# Patient Record
Sex: Female | Born: 1975 | Race: White | Hispanic: No | Smoking: Current every day smoker
Health system: Southern US, Community
[De-identification: ages and names within clinical notes are randomized; demographics above are authoritative.]

## PROBLEM LIST (undated history)

## (undated) DIAGNOSIS — N3281 Overactive bladder: Secondary | ICD-10-CM

## (undated) DIAGNOSIS — F329 Major depressive disorder, single episode, unspecified: Secondary | ICD-10-CM

## (undated) DIAGNOSIS — H7290 Unspecified perforation of tympanic membrane, unspecified ear: Secondary | ICD-10-CM

## (undated) DIAGNOSIS — F32A Depression, unspecified: Secondary | ICD-10-CM

## (undated) DIAGNOSIS — Z8489 Family history of other specified conditions: Secondary | ICD-10-CM

## (undated) DIAGNOSIS — F410 Panic disorder [episodic paroxysmal anxiety] without agoraphobia: Secondary | ICD-10-CM

## (undated) DIAGNOSIS — R238 Other skin changes: Secondary | ICD-10-CM

## (undated) DIAGNOSIS — I639 Cerebral infarction, unspecified: Secondary | ICD-10-CM

## (undated) DIAGNOSIS — F319 Bipolar disorder, unspecified: Secondary | ICD-10-CM

## (undated) DIAGNOSIS — Z22322 Carrier or suspected carrier of Methicillin resistant Staphylococcus aureus: Secondary | ICD-10-CM

## (undated) DIAGNOSIS — F419 Anxiety disorder, unspecified: Secondary | ICD-10-CM

## (undated) DIAGNOSIS — D649 Anemia, unspecified: Secondary | ICD-10-CM

## (undated) DIAGNOSIS — K5792 Diverticulitis of intestine, part unspecified, without perforation or abscess without bleeding: Secondary | ICD-10-CM

## (undated) DIAGNOSIS — R233 Spontaneous ecchymoses: Secondary | ICD-10-CM

## (undated) DIAGNOSIS — B009 Herpesviral infection, unspecified: Secondary | ICD-10-CM

## (undated) DIAGNOSIS — K589 Irritable bowel syndrome without diarrhea: Secondary | ICD-10-CM

## (undated) DIAGNOSIS — I10 Essential (primary) hypertension: Secondary | ICD-10-CM

## (undated) DIAGNOSIS — R519 Headache, unspecified: Secondary | ICD-10-CM

## (undated) HISTORY — PX: ABDOMINAL HYSTERECTOMY: SHX81

## (undated) HISTORY — PX: MULTIPLE TOOTH EXTRACTIONS: SHX2053

## (undated) HISTORY — PX: TUBAL LIGATION: SHX77

## (undated) HISTORY — PX: COLONOSCOPY: SHX174

---
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
## 1997-10-09 ENCOUNTER — Inpatient Hospital Stay (HOSPITAL_COMMUNITY): Admission: AD | Admit: 1997-10-09 | Discharge: 1997-10-09 | Payer: Self-pay | Admitting: Obstetrics

## 1997-11-05 ENCOUNTER — Ambulatory Visit (HOSPITAL_COMMUNITY): Admission: RE | Admit: 1997-11-05 | Discharge: 1997-11-05 | Payer: Self-pay | Admitting: *Deleted

## 1998-02-26 ENCOUNTER — Inpatient Hospital Stay (HOSPITAL_COMMUNITY): Admission: AD | Admit: 1998-02-26 | Discharge: 1998-02-26 | Payer: Self-pay | Admitting: *Deleted

## 1998-03-05 ENCOUNTER — Inpatient Hospital Stay (HOSPITAL_COMMUNITY): Admission: AD | Admit: 1998-03-05 | Discharge: 1998-03-07 | Payer: Self-pay | Admitting: *Deleted

## 1998-12-28 ENCOUNTER — Emergency Department (HOSPITAL_COMMUNITY): Admission: EM | Admit: 1998-12-28 | Discharge: 1998-12-28 | Payer: Self-pay | Admitting: Emergency Medicine

## 1999-03-04 ENCOUNTER — Ambulatory Visit (HOSPITAL_COMMUNITY): Admission: RE | Admit: 1999-03-04 | Discharge: 1999-03-04 | Payer: Self-pay | Admitting: *Deleted

## 2001-02-04 ENCOUNTER — Emergency Department (HOSPITAL_COMMUNITY): Admission: EM | Admit: 2001-02-04 | Discharge: 2001-02-04 | Payer: Self-pay | Admitting: Physical Therapy

## 2003-04-19 ENCOUNTER — Emergency Department (HOSPITAL_COMMUNITY): Admission: EM | Admit: 2003-04-19 | Discharge: 2003-04-19 | Payer: Self-pay | Admitting: Emergency Medicine

## 2004-11-19 IMAGING — CT CT ABDOMEN W/ CM
1 of 4 series · 14 of 32 positions shown, 19 images · IV contrast (omnipaque)
Comparison: none

CLINICAL DATA: Abdominal pain. 
 CT ABDOMEN WITH CONTRAST
 Multidetector helical scans through the abdomen were performed after oral and IV contrast media were given.   150 cc of Omnipaque 300 were given as the contrast media.  
 The lung bases are clear.   The liver enhances normally with no focal abnormality and no ductal dilatation is seen.   No gallstones are noted.  The pancreas is normal in size as are the adrenal glands and spleen.  The kidneys enhance normally.  The abdominal aorta is normal in caliber.
 IMPRESSION
 Negative CT scan of the abdomen.
 CT PELVIS WITH CONTRAST
 Scans were continued through the pelvis after oral and IV contrast media were given.  The appendix is well seen and appears normal.  There are multiple low attenuation structures in both adnexa consistent with multiple bilateral follicles.  No diverticulitis is seen.  The urinary bladder is unremarkable.   The uterus is normal in size.  No fluid is seen in the cul-de-sac.
 1.  The appendix appears normal.
 2.  Probable small bilateral ovarian follicles.  No significant free fluid is seen.
 3.  No CT evidence of diverticulitis.

[Series 2: abd/pelvis 5.0 b30f · axial · 0.66mm/px · z∈[-716,-336]mm · 14 of 88 slices shown, 19 images]
[im 6/88  soft-tissue]
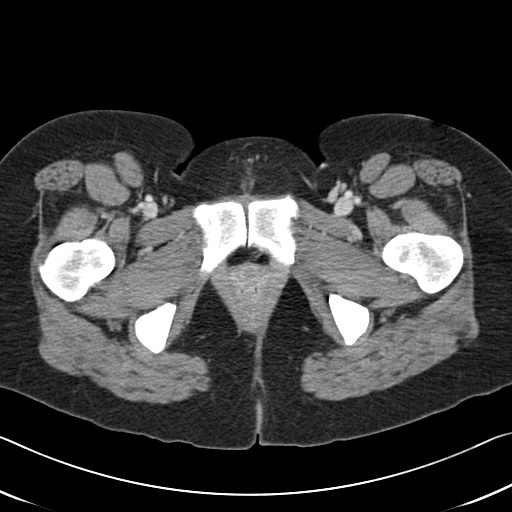
[im 6/88  bone]
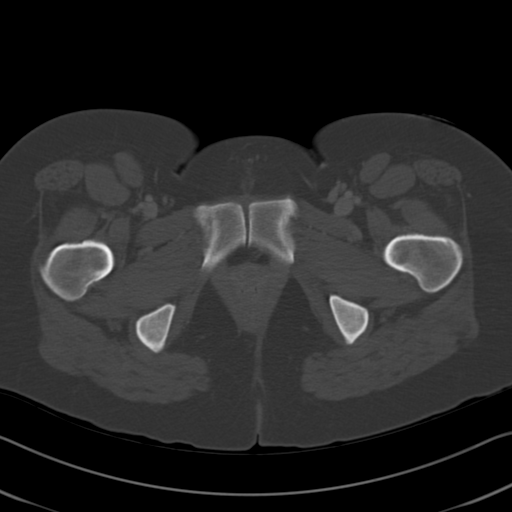
[im 11/88  soft-tissue]
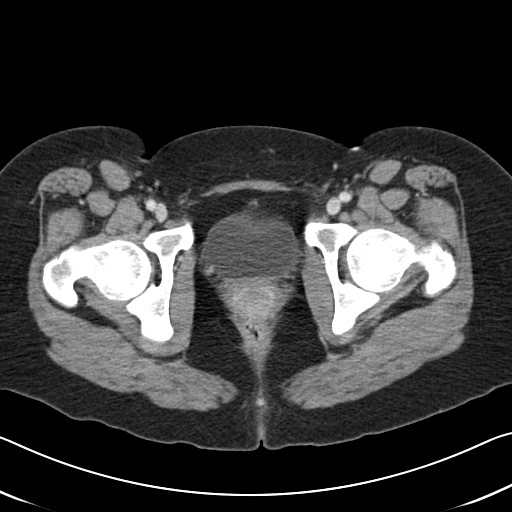
[im 17/88  soft-tissue]
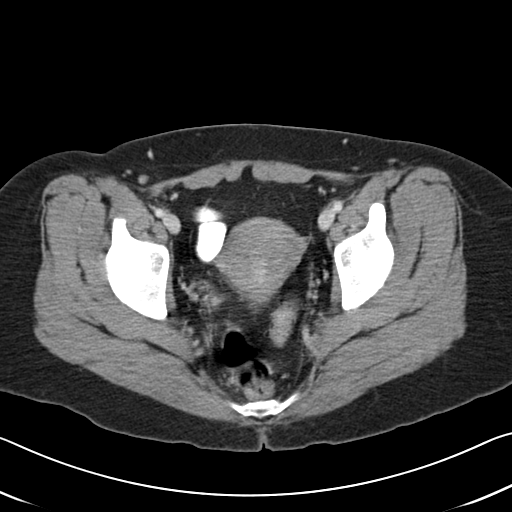
[im 28/88  soft-tissue]
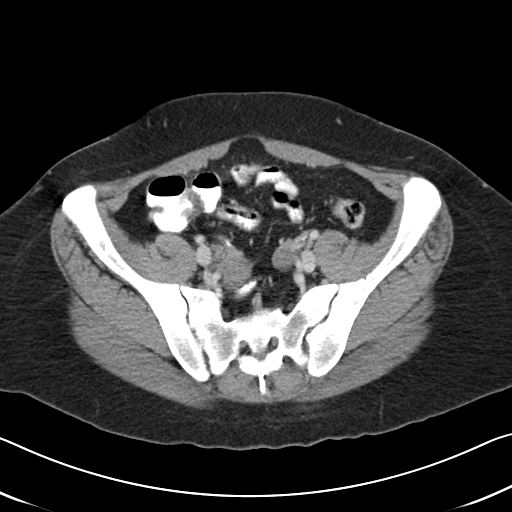
[im 33/88  soft-tissue]
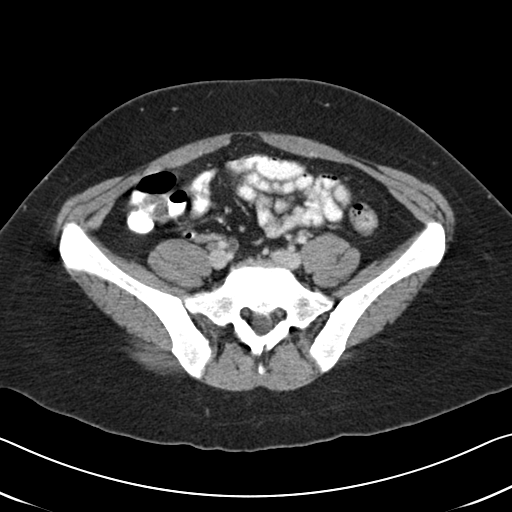
[im 39/88  soft-tissue]
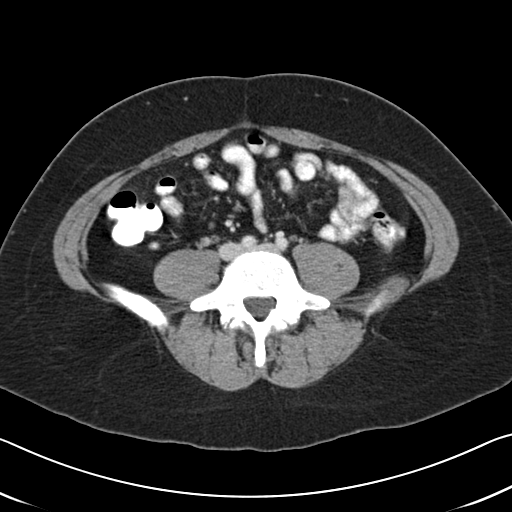
[im 44/88  soft-tissue]
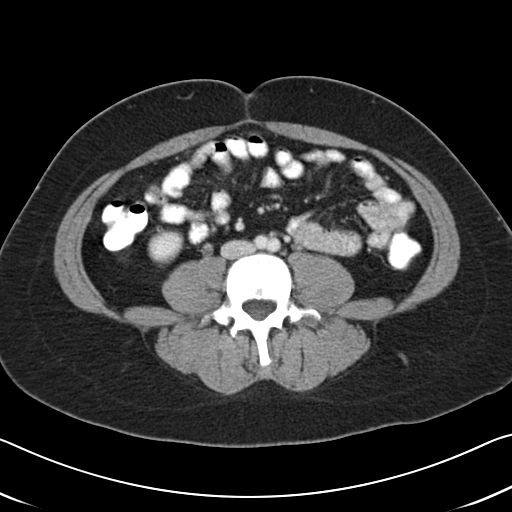
[im 49/88  soft-tissue]
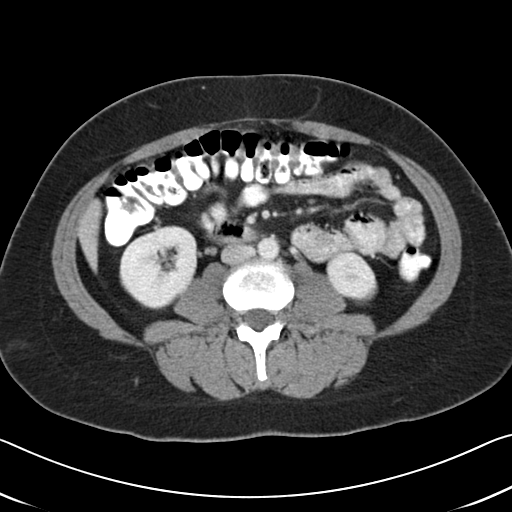
[im 55/88  soft-tissue]
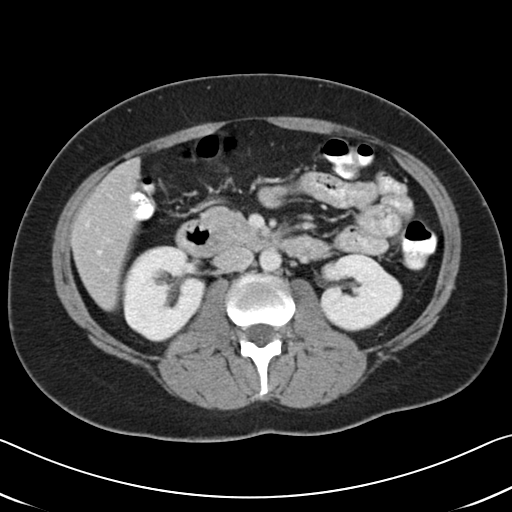
[im 55/88  bone]
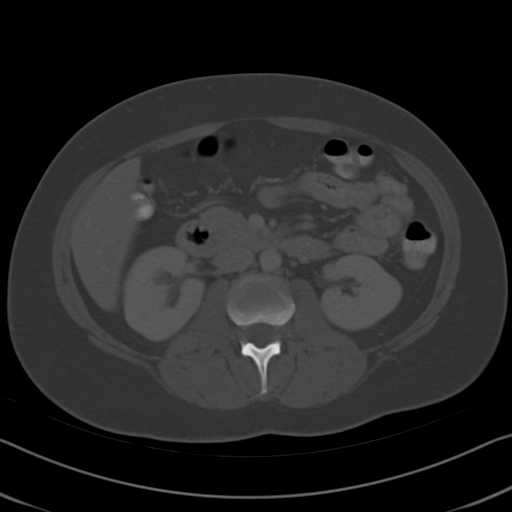
[im 60/88  soft-tissue]
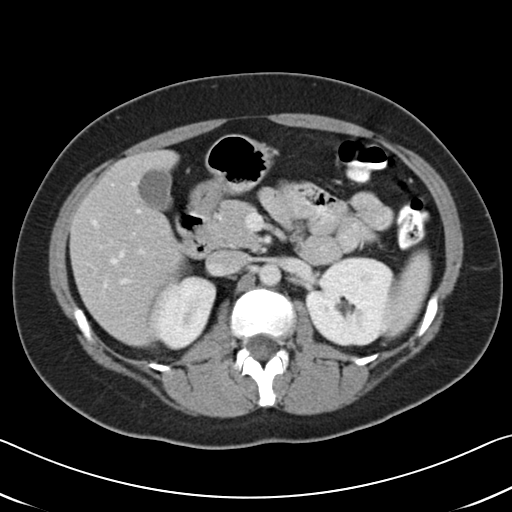
[im 66/88  lung]
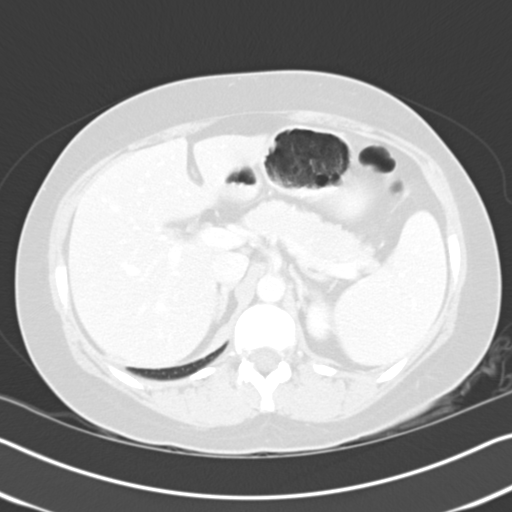
[im 71/88  soft-tissue]
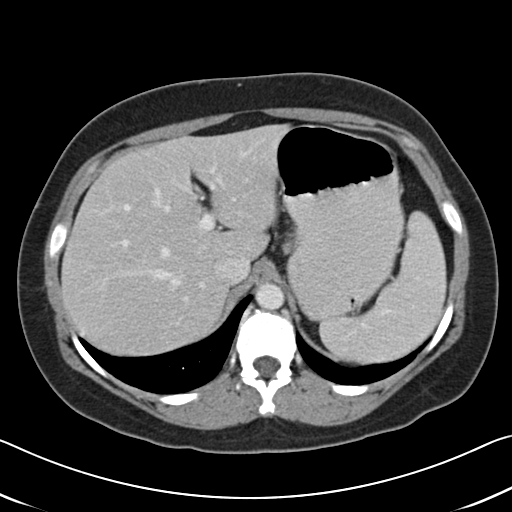
[im 71/88  lung]
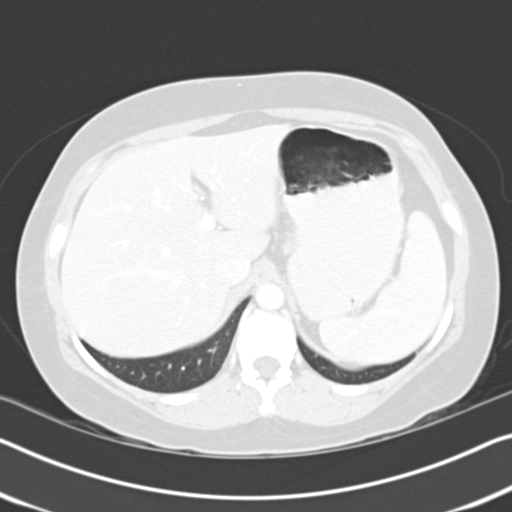
[im 77/88  soft-tissue]
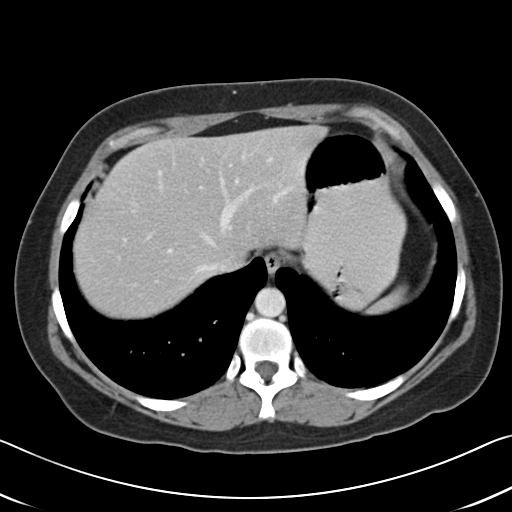
[im 77/88  lung]
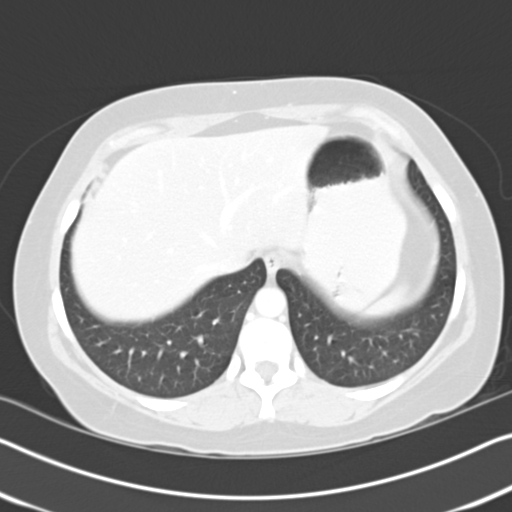
[im 82/88  soft-tissue]
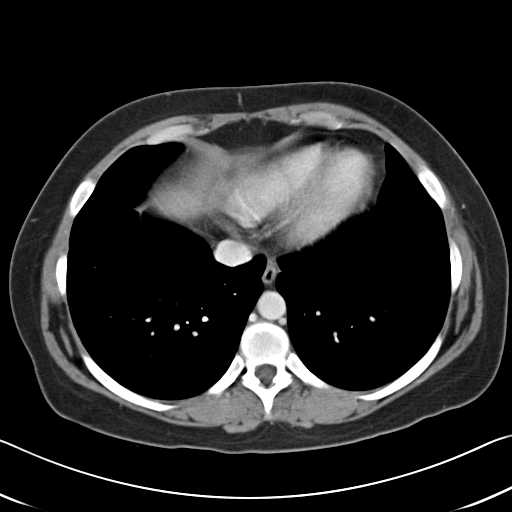
[im 82/88  lung]
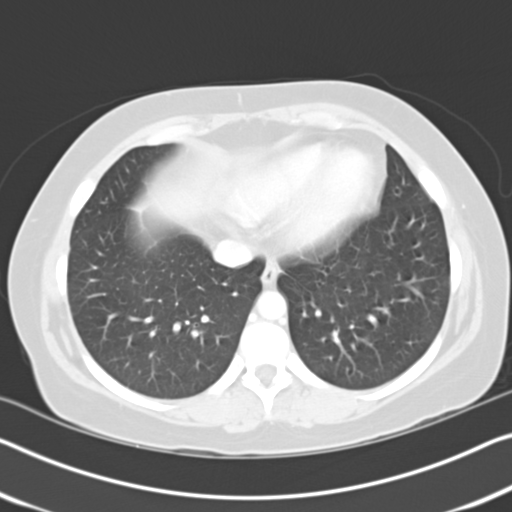

[14 of 32 positions shown; findings below may reference images not displayed]

## 2005-10-09 ENCOUNTER — Emergency Department (HOSPITAL_COMMUNITY): Admission: EM | Admit: 2005-10-09 | Discharge: 2005-10-09 | Payer: Self-pay | Admitting: Emergency Medicine

## 2005-10-12 ENCOUNTER — Emergency Department (HOSPITAL_COMMUNITY): Admission: EM | Admit: 2005-10-12 | Discharge: 2005-10-12 | Payer: Self-pay | Admitting: Emergency Medicine

## 2007-05-15 IMAGING — CR DG ANKLE COMPLETE 3+V*R*
3 series · 3 of 3 positions shown · non-contrast
Comparison: none

CLINICAL DATA: Fall, pain. 
 RIGHT ANKLE ? 3 VIEW:

[t ankle joint lat right]
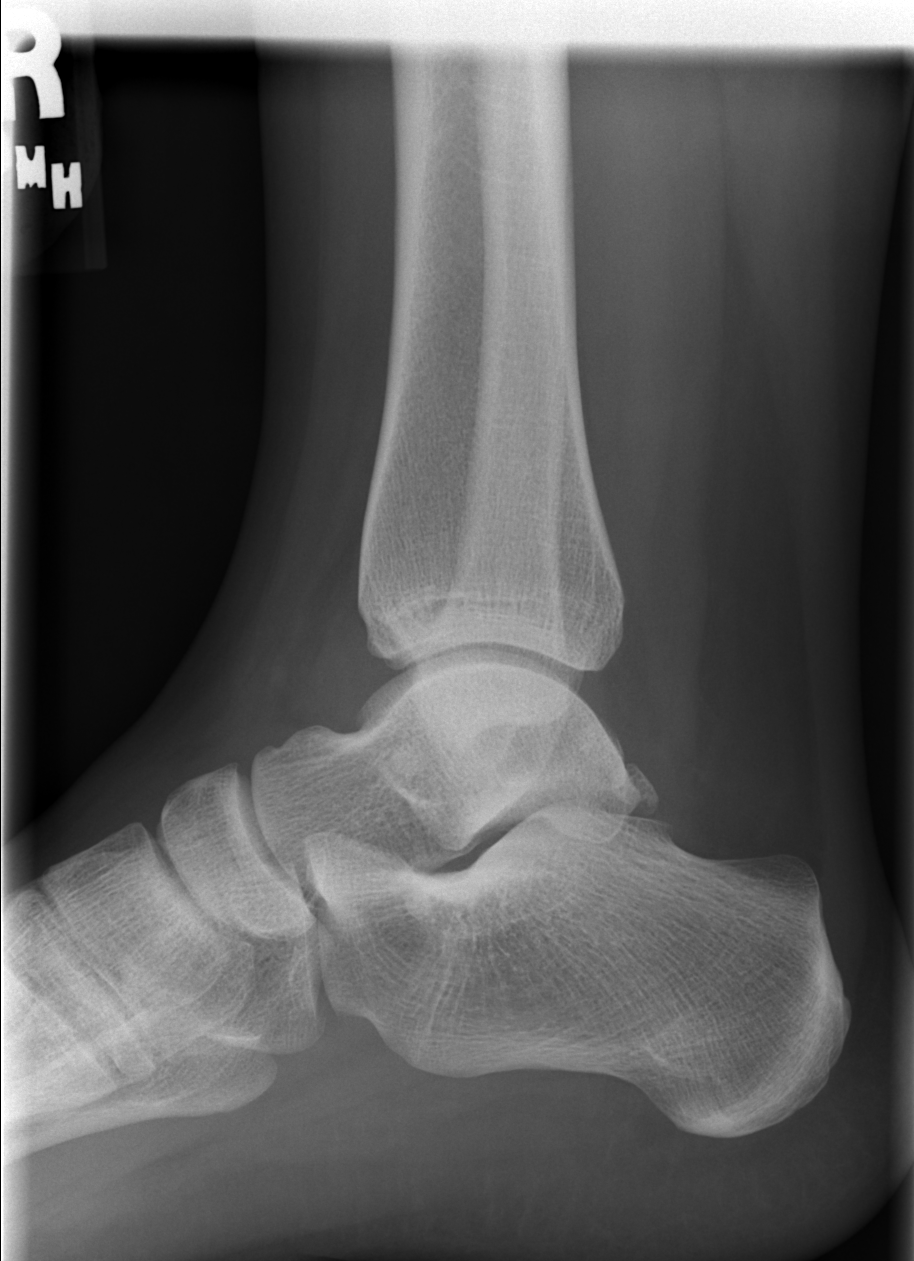

[t ankle joint ap right]
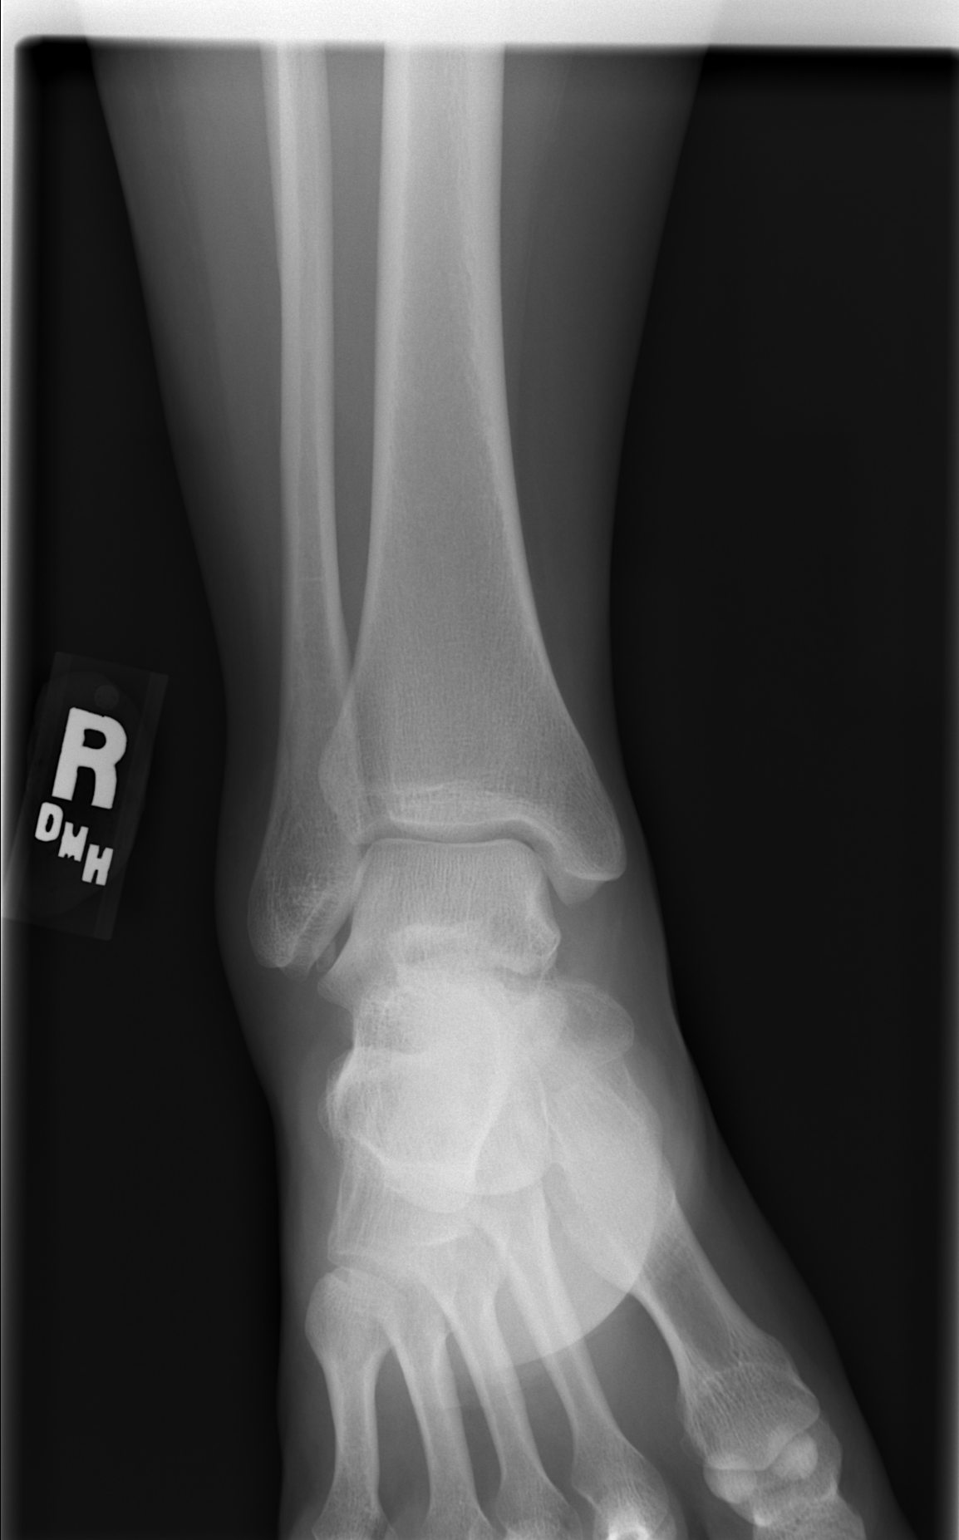

[t ankle joint oblique right]
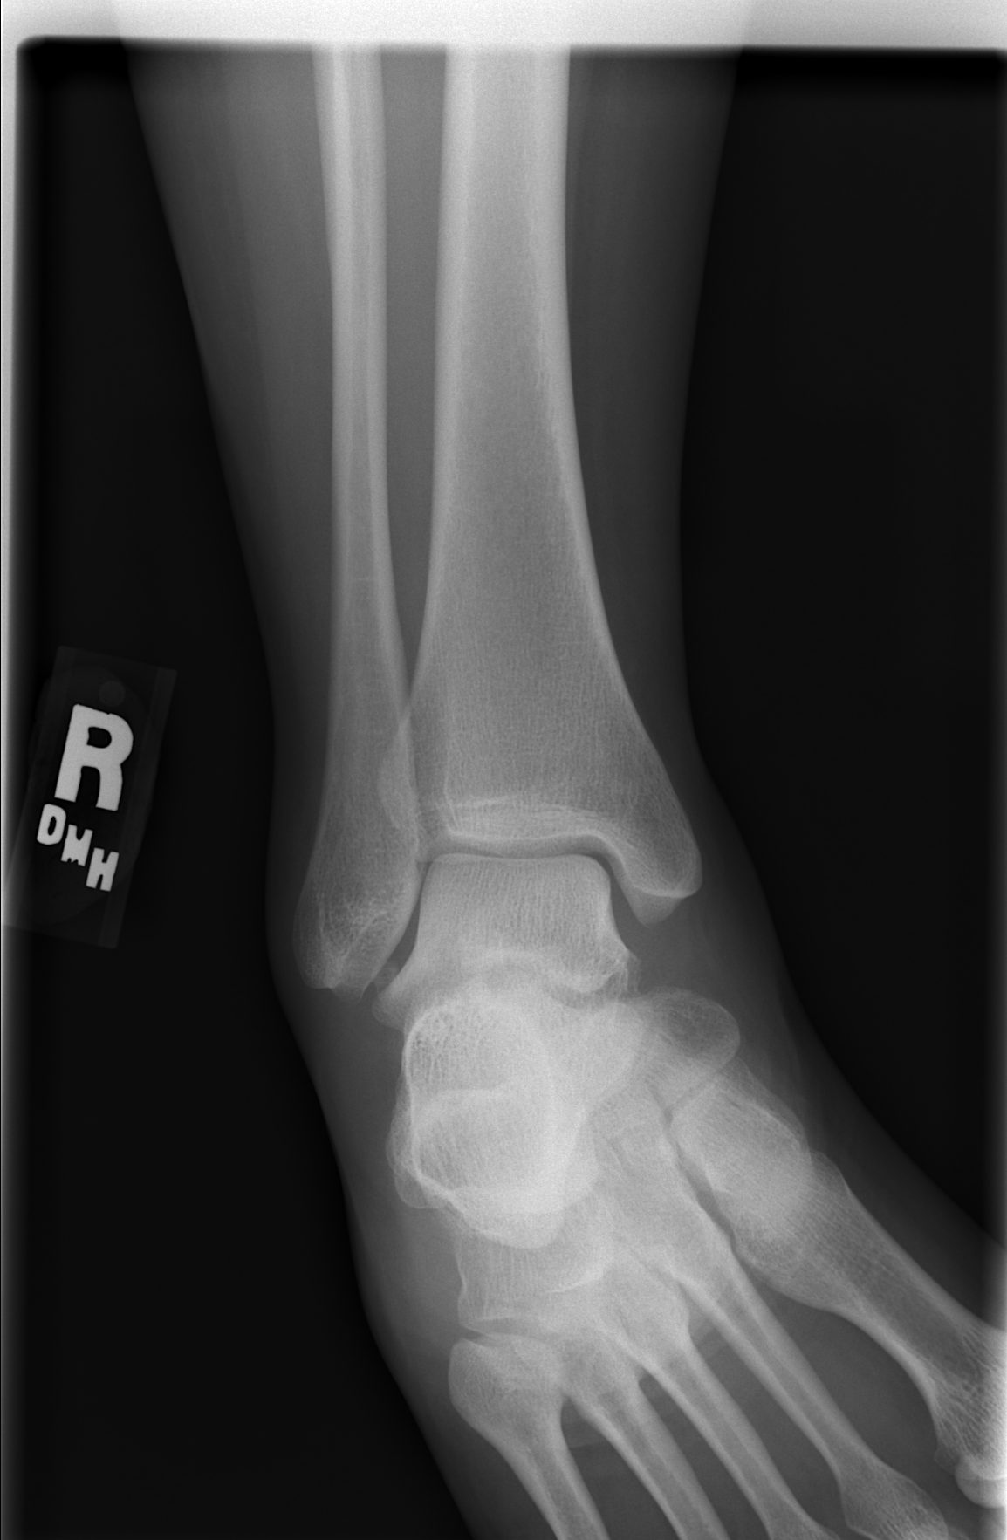

[3 of 3 positions shown; findings below may reference images not displayed]

FINDINGS: A well-corticated bony fragment is seen off the lateral malleolus likely due to remote injury.  No definite acute bony or joint abnormality is seen.  There is some mild soft tissue swelling about the lateral aspect of the ankle.  No joint effusion.
IMPRESSION: Mild lateral soft tissue swelling.  No acute finding.

## 2012-07-06 ENCOUNTER — Other Ambulatory Visit: Payer: Self-pay | Admitting: Obstetrics and Gynecology

## 2012-07-07 ENCOUNTER — Encounter (HOSPITAL_COMMUNITY): Payer: Self-pay

## 2012-07-07 ENCOUNTER — Encounter (HOSPITAL_COMMUNITY)
Admission: RE | Admit: 2012-07-07 | Discharge: 2012-07-07 | Disposition: A | Discharge status: Home / Self Care | Payer: Medicaid Other | Source: Ambulatory Visit | Attending: Obstetrics and Gynecology | Admitting: Obstetrics and Gynecology

## 2012-07-07 LAB — CBC
MCH: 29.9 pg (ref 26.0–34.0)
MCHC: 32.5 g/dL (ref 30.0–36.0)
Platelets: 301 10*3/uL (ref 150–400)
RBC: 4.51 MIL/uL (ref 3.87–5.11)

## 2012-07-07 LAB — SURGICAL PCR SCREEN: MRSA, PCR: NEGATIVE

## 2012-07-07 NOTE — Patient Instructions (Addendum)
   Your procedure is scheduled ZO:[id number] April 15th  Enter through the Main Entrance of [facility] at:9am Pick up the phone at the desk and dial [phone] and inform us of your arrival.  Please call this number if you have any problems the morning of surgery: [phone]  Remember: Do not eat or drink anything after midnight on Monday   Do not wear jewelry, make-up, or FINGER nail polish No metal in your hair or on your body. Do not wear lotions, powders, perfumes. You may wear deodorant.  Please use your CHG wash as directed prior to surgery.  Do not shave anywhere for at least 12 hours prior to first CHG shower.  Do not bring valuables to the hospital.   Leave suitcase in the car. After Surgery it may be brought to your room. For patients being admitted to the hospital, checkout time is 11:00am the day of discharge.     -+

## 2012-07-17 NOTE — H&P (Signed)
Tabitha Christensen is an 37 y.o. female with a history of progressive dysmenorrhea and pelvic pain. Has not responded to conservative treatment and patient no desires definitive treatment by hysterectomy and possible bilateral salpingo oophorectomy. She has had prior tubal ligation and in an effort to avoid surgery she was treated with depo Lupron 3.75 mgs. She stopped this treatment due to side effects and at this point wants to proceed with surgical treatment.   Pertinent Gynecological History: Menses: flow is moderate but with severe pain  Contraception: tubal ligation Previous GYN Procedures: Tubal sterilization  Last pap: normal Date: 01/2012 OB History: G4 P3013     No past medical history on file.  Past Surgical History  Procedure Laterality Date  . Tubal ligation      No family history on file.  Social History:  reports that she has been smoking Cigarettes.  She has a 15 pack-year smoking history. She does not have any smokeless tobacco history on file. She reports that  drinks alcohol. She reports that she does not use illicit drugs.  Allergies:  Allergies  Allergen Reactions  . Codeine Nausea And Vomiting    No prescriptions prior to admission    Review of Systems  Constitutional: Positive for weight loss. Negative for fever and chills.  Respiratory: Negative for cough, hemoptysis, sputum production and shortness of breath.   Cardiovascular: Negative.  Negative for chest pain, palpitations, orthopnea and claudication.  Gastrointestinal: Negative for heartburn, nausea, vomiting, abdominal pain, diarrhea, constipation, blood in stool and melena.  Genitourinary: Negative for dysuria, urgency, frequency and hematuria.  Gyn: cycles are regular and extremely painful. Also having pain with sex. No discharge or itch. Recent STD screen was negative.  There were no vitals taken for this visit. Physical Exam  Constitutional: She is oriented to person, place, and time. She appears  well-developed.  HENT:  Head: Normocephalic and atraumatic.  Eyes: Conjunctivae and EOM are normal. Pupils are equal, round, and reactive to light.  Neck: Normal range of motion. Neck supple. No tracheal deviation present. No thyromegaly present.  Cardiovascular: Normal rate, regular rhythm and normal heart sounds.  Exam reveals no gallop.   No murmur heard. Respiratory: Effort normal and breath sounds normal. She has no wheezes. She has no rales.  GI: Soft. Bowel sounds are normal. She exhibits no distension and no mass. There is no tenderness. There is no rebound and no guarding.  Neurological: She is alert and oriented to person, place, and time. She has normal reflexes.  Skin: Skin is warm and dry.  Gyn Exam   External genitalia: within normal limits   BUS: within normal limits   Vagina: without lesion. Normal discharge   Cervix is without gross lesion and is tender to touch and motion   Uterus: normal size and shape. Tender to touch and motion. Plus 2 decensus   Adnexa: No masses are noted  No results found for this or any previous visit (from the past 24 hour(s)).  No results found.  Impression: Chronic pelvic pain with dysmenorrhea and dyspareunia  Plan: LAVH possible bilateral salpingo oophorectomy possible abdominal hysterectomy.  This risks of surgery were discussed with the patient and her partner and this included the risk for infection, hemorrhage, injury to adjacent structures and blood clot formation. She also understands that an abdominal incision may be necessary and if her ovaries are removed hormone replacement treatment may be needed.   Lyell Clugston 07/17/2012, 2:16 PM

## 2012-07-18 ENCOUNTER — Ambulatory Visit (HOSPITAL_COMMUNITY): Payer: Medicaid Other | Admitting: Anesthesiology

## 2012-07-18 ENCOUNTER — Encounter (HOSPITAL_COMMUNITY)
Admission: RE | Disposition: A | Discharge status: Home / Self Care | Payer: Self-pay | Source: Ambulatory Visit | Attending: Obstetrics and Gynecology

## 2012-07-18 ENCOUNTER — Encounter (HOSPITAL_COMMUNITY): Payer: Self-pay | Admitting: Anesthesiology

## 2012-07-18 ENCOUNTER — Other Ambulatory Visit: Payer: Self-pay | Admitting: Obstetrics and Gynecology

## 2012-07-18 ENCOUNTER — Observation Stay (HOSPITAL_COMMUNITY)
Admission: RE | Admit: 2012-07-18 | Discharge: 2012-07-19 | Disposition: A | Discharge status: Home / Self Care | Payer: Medicaid Other | Source: Ambulatory Visit | Attending: Obstetrics and Gynecology | Admitting: Obstetrics and Gynecology

## 2012-07-18 DIAGNOSIS — R102 Pelvic and perineal pain: Secondary | ICD-10-CM

## 2012-07-18 DIAGNOSIS — IMO0002 Reserved for concepts with insufficient information to code with codable children: Secondary | ICD-10-CM | POA: Insufficient documentation

## 2012-07-18 DIAGNOSIS — N840 Polyp of corpus uteri: Secondary | ICD-10-CM | POA: Insufficient documentation

## 2012-07-18 DIAGNOSIS — N949 Unspecified condition associated with female genital organs and menstrual cycle: Secondary | ICD-10-CM | POA: Insufficient documentation

## 2012-07-18 DIAGNOSIS — N946 Dysmenorrhea, unspecified: Principal | ICD-10-CM | POA: Insufficient documentation

## 2012-07-18 HISTORY — PX: LAPAROSCOPIC ASSISTED VAGINAL HYSTERECTOMY: SHX5398

## 2012-07-18 LAB — BASIC METABOLIC PANEL
CO2: 24 mEq/L (ref 19–32)
Chloride: 103 mEq/L (ref 96–112)
Creatinine, Ser: 0.54 mg/dL (ref 0.50–1.10)
GFR calc Af Amer: >90 mL/min (ref >90)
Sodium: 138 mEq/L (ref 135–145)

## 2012-07-18 LAB — HEMOGLOBIN: Hemoglobin: 12.1 g/dL (ref 12.0–15.0)

## 2012-07-18 LAB — PROTIME-INR: Prothrombin Time: 13.4 seconds (ref 11.6–15.2)

## 2012-07-18 LAB — APTT: aPTT: 32 seconds (ref 24–37)

## 2012-07-18 LAB — PREGNANCY, URINE: Preg Test, Ur: NEGATIVE

## 2012-07-18 SURGERY — HYSTERECTOMY, VAGINAL, LAPAROSCOPY-ASSISTED
Anesthesia: General | Wound class: Clean Contaminated

## 2012-07-18 MED ORDER — DEXAMETHASONE SODIUM PHOSPHATE 10 MG/ML IJ SOLN
INTRAMUSCULAR | Status: AC
  Filled 2012-07-18: qty 1

## 2012-07-18 MED ORDER — FENTANYL CITRATE 0.05 MG/ML IJ SOLN
INTRAMUSCULAR | Status: AC
  Filled 2012-07-18: qty 5

## 2012-07-18 MED ORDER — GLYCOPYRROLATE 0.2 MG/ML IJ SOLN
INTRAMUSCULAR | Status: DC | PRN
  Administered 2012-07-18: 0.1 mg via INTRAVENOUS
  Administered 2012-07-18: 0.4 mg via INTRAVENOUS

## 2012-07-18 MED ORDER — NEOSTIGMINE METHYLSULFATE 1 MG/ML IJ SOLN
INTRAMUSCULAR | Status: AC
  Filled 2012-07-18: qty 1

## 2012-07-18 MED ORDER — BUPIVACAINE HCL (PF) 0.25 % IJ SOLN
INTRAMUSCULAR | Status: AC
  Filled 2012-07-18: qty 30

## 2012-07-18 MED ORDER — IBUPROFEN 600 MG PO TABS
600.0000 mg | ORAL_TABLET | Freq: Four times a day (QID) | ORAL | Status: DC | PRN
  Administered 2012-07-18 – 2012-07-19 (×2): 600 mg via ORAL
  Filled 2012-07-18 (×2): qty 1

## 2012-07-18 MED ORDER — ROCURONIUM BROMIDE 50 MG/5ML IV SOLN
INTRAVENOUS | Status: AC
  Filled 2012-07-18: qty 1

## 2012-07-18 MED ORDER — METOCLOPRAMIDE HCL 5 MG/ML IJ SOLN: 10.0000 mg | Freq: Once | INTRAMUSCULAR | Status: AC | PRN

## 2012-07-18 MED ORDER — PROPOFOL 10 MG/ML IV EMUL
INTRAVENOUS | Status: AC
  Filled 2012-07-18: qty 20

## 2012-07-18 MED ORDER — ONDANSETRON HCL 4 MG/2ML IJ SOLN: 4.0000 mg | Freq: Four times a day (QID) | INTRAMUSCULAR | Status: DC | PRN

## 2012-07-18 MED ORDER — GLYCOPYRROLATE 0.2 MG/ML IJ SOLN
INTRAMUSCULAR | Status: AC
  Filled 2012-07-18: qty 3

## 2012-07-18 MED ORDER — HYDROMORPHONE HCL PF 1 MG/ML IJ SOLN
0.2500 mg | INTRAMUSCULAR | Status: DC | PRN
  Administered 2012-07-18: 0.25 mg via INTRAVENOUS

## 2012-07-18 MED ORDER — LACTATED RINGERS IV SOLN: INTRAVENOUS | Status: DC

## 2012-07-18 MED ORDER — DEXAMETHASONE SODIUM PHOSPHATE 10 MG/ML IJ SOLN
INTRAMUSCULAR | Status: DC | PRN
  Administered 2012-07-18: 10 mg via INTRAVENOUS

## 2012-07-18 MED ORDER — NEOSTIGMINE METHYLSULFATE 1 MG/ML IJ SOLN
INTRAMUSCULAR | Status: DC | PRN
  Administered 2012-07-18: 2 mg via INTRAVENOUS

## 2012-07-18 MED ORDER — HYDROMORPHONE HCL PF 1 MG/ML IJ SOLN
INTRAMUSCULAR | Status: AC
  Filled 2012-07-18: qty 1

## 2012-07-18 MED ORDER — MORPHINE SULFATE (PF) 1 MG/ML IV SOLN
INTRAVENOUS | Status: DC
  Administered 2012-07-18: 14:00:00 via INTRAVENOUS
  Filled 2012-07-18: qty 25

## 2012-07-18 MED ORDER — ONDANSETRON HCL 4 MG/2ML IJ SOLN
INTRAMUSCULAR | Status: AC
  Filled 2012-07-18: qty 2

## 2012-07-18 MED ORDER — CEFAZOLIN SODIUM 1-5 GM-% IV SOLN
1.0000 g | Freq: Three times a day (TID) | INTRAVENOUS | Status: DC
  Administered 2012-07-18 – 2012-07-19 (×2): 1 g via INTRAVENOUS
  Filled 2012-07-18 (×3): qty 50

## 2012-07-18 MED ORDER — FENTANYL CITRATE 0.05 MG/ML IJ SOLN
INTRAMUSCULAR | Status: DC | PRN
  Administered 2012-07-18: 50 ug via INTRAVENOUS
  Administered 2012-07-18: 100 ug via INTRAVENOUS
  Administered 2012-07-18: 50 ug via INTRAVENOUS

## 2012-07-18 MED ORDER — OXYCODONE-ACETAMINOPHEN 5-325 MG PO TABS
1.0000 | ORAL_TABLET | ORAL | Status: DC | PRN
  Administered 2012-07-18 – 2012-07-19 (×2): 1 via ORAL
  Filled 2012-07-18 (×3): qty 1

## 2012-07-18 MED ORDER — MEPERIDINE HCL 25 MG/ML IJ SOLN: 6.2500 mg | INTRAMUSCULAR | Status: DC | PRN

## 2012-07-18 MED ORDER — MENTHOL 3 MG MT LOZG: 1.0000 | LOZENGE | OROMUCOSAL | Status: DC | PRN

## 2012-07-18 MED ORDER — MIDAZOLAM HCL 5 MG/5ML IJ SOLN
INTRAMUSCULAR | Status: DC | PRN
  Administered 2012-07-18: 2 mg via INTRAVENOUS

## 2012-07-18 MED ORDER — PROPOFOL 10 MG/ML IV BOLUS
INTRAVENOUS | Status: DC | PRN
  Administered 2012-07-18: 150 mg via INTRAVENOUS
  Administered 2012-07-18: 50 mg via INTRAVENOUS

## 2012-07-18 MED ORDER — LIDOCAINE HCL (CARDIAC) 20 MG/ML IV SOLN
INTRAVENOUS | Status: AC
  Filled 2012-07-18: qty 5

## 2012-07-18 MED ORDER — ONDANSETRON HCL 4 MG/2ML IJ SOLN
INTRAMUSCULAR | Status: DC | PRN
  Administered 2012-07-18: 4 mg via INTRAVENOUS

## 2012-07-18 MED ORDER — LACTATED RINGERS IV SOLN
INTRAVENOUS | Status: AC
  Administered 2012-07-18 (×3): via INTRAVENOUS

## 2012-07-18 MED ORDER — ROCURONIUM BROMIDE 100 MG/10ML IV SOLN
INTRAVENOUS | Status: DC | PRN
  Administered 2012-07-18: 5 mg via INTRAVENOUS
  Administered 2012-07-18: 35 mg via INTRAVENOUS

## 2012-07-18 MED ORDER — HYDROMORPHONE HCL PF 1 MG/ML IJ SOLN
INTRAMUSCULAR | Status: DC | PRN
  Administered 2012-07-18: 1 mg via INTRAVENOUS

## 2012-07-18 MED ORDER — LIDOCAINE-EPINEPHRINE 1 %-1:100000 IJ SOLN
INTRAMUSCULAR | Status: DC | PRN
  Administered 2012-07-18: 6 mL

## 2012-07-18 MED ORDER — MIDAZOLAM HCL 2 MG/2ML IJ SOLN
INTRAMUSCULAR | Status: AC
  Filled 2012-07-18: qty 2

## 2012-07-18 MED ORDER — DIPHENHYDRAMINE HCL 50 MG/ML IJ SOLN: 12.5000 mg | Freq: Four times a day (QID) | INTRAMUSCULAR | Status: DC | PRN

## 2012-07-18 MED ORDER — BUPIVACAINE HCL (PF) 0.25 % IJ SOLN
INTRAMUSCULAR | Status: DC | PRN
  Administered 2012-07-18: 8 mL

## 2012-07-18 MED ORDER — CEFAZOLIN SODIUM-DEXTROSE 2-3 GM-% IV SOLR
INTRAVENOUS | Status: AC
  Filled 2012-07-18: qty 50

## 2012-07-18 MED ORDER — NALOXONE HCL 0.4 MG/ML IJ SOLN: 0.4000 mg | INTRAMUSCULAR | Status: DC | PRN

## 2012-07-18 MED ORDER — CEFAZOLIN SODIUM-DEXTROSE 2-3 GM-% IV SOLR
2.0000 g | INTRAVENOUS | Status: AC
  Administered 2012-07-18: 2 g via INTRAVENOUS

## 2012-07-18 MED ORDER — SODIUM CHLORIDE 0.9 % IJ SOLN: 9.0000 mL | INTRAMUSCULAR | Status: DC | PRN

## 2012-07-18 MED ORDER — LIDOCAINE HCL (CARDIAC) 20 MG/ML IV SOLN
INTRAVENOUS | Status: DC | PRN
  Administered 2012-07-18: 60 mg via INTRAVENOUS

## 2012-07-18 MED ORDER — DIPHENHYDRAMINE HCL 12.5 MG/5ML PO ELIX: 12.5000 mg | ORAL_SOLUTION | Freq: Four times a day (QID) | ORAL | Status: DC | PRN

## 2012-07-18 SURGICAL SUPPLY — 50 items
APL SKNCLS STERI-STRIP NONHPOA (GAUZE/BANDAGES/DRESSINGS)
BENZOIN TINCTURE PRP APPL 2/3 (GAUZE/BANDAGES/DRESSINGS) IMPLANT
BLADE SURG 15 STRL LF C SS BP (BLADE) ×2 IMPLANT
BLADE SURG 15 STRL SS (BLADE) ×3
CANISTER SUCTION 2500CC (MISCELLANEOUS) ×3 IMPLANT
CLOTH BEACON ORANGE TIMEOUT ST (SAFETY) ×3 IMPLANT
CONT PATH 16OZ SNAP LID 3702 (MISCELLANEOUS) ×3 IMPLANT
COVER TABLE BACK 60X90 (DRAPES) ×3 IMPLANT
DECANTER SPIKE VIAL GLASS SM (MISCELLANEOUS) IMPLANT
ELECT REM PT RETURN 9FT ADLT (ELECTROSURGICAL) ×3
ELECTRODE REM PT RTRN 9FT ADLT (ELECTROSURGICAL) ×2 IMPLANT
FORCEPS CUTTING 33CM 5MM (CUTTING FORCEPS) IMPLANT
GAUZE PACKING IODOFORM 2 (PACKING) IMPLANT
GLOVE BIO SURGEON STRL SZ7.5 (GLOVE) ×6 IMPLANT
GLOVE BIOGEL PI IND STRL 6.5 (GLOVE) ×2 IMPLANT
GLOVE BIOGEL PI IND STRL 7.5 (GLOVE) ×2 IMPLANT
GLOVE BIOGEL PI INDICATOR 6.5 (GLOVE) ×1
GLOVE BIOGEL PI INDICATOR 7.5 (GLOVE) ×1
GOWN PREVENTION PLUS LG XLONG (DISPOSABLE) ×6 IMPLANT
GOWN PREVENTION PLUS XXLARGE (GOWN DISPOSABLE) ×3 IMPLANT
GOWN STRL REIN XL XLG (GOWN DISPOSABLE) ×12 IMPLANT
NS IRRIG 1000ML POUR BTL (IV SOLUTION) ×3 IMPLANT
PACK ABDOMINAL GYN (CUSTOM PROCEDURE TRAY) ×3 IMPLANT
PACK LAVH (CUSTOM PROCEDURE TRAY) ×3 IMPLANT
PAD OB MATERNITY 4.3X12.25 (PERSONAL CARE ITEMS) ×3 IMPLANT
PROTECTOR NERVE ULNAR (MISCELLANEOUS) ×3 IMPLANT
SET IRRIG TUBING LAPAROSCOPIC (IRRIGATION / IRRIGATOR) IMPLANT
SOLUTION ELECTROLUBE (MISCELLANEOUS) ×3 IMPLANT
SPONGE LAP 18X18 X RAY DECT (DISPOSABLE) ×6 IMPLANT
STAPLER VISISTAT 35W (STAPLE) IMPLANT
STRIP CLOSURE SKIN 1/2X4 (GAUZE/BANDAGES/DRESSINGS) IMPLANT
STRIP CLOSURE SKIN 1/4X3 (GAUZE/BANDAGES/DRESSINGS) ×3 IMPLANT
SUT CHROMIC 0 CT 1 (SUTURE) IMPLANT
SUT PLAIN 2 0 XLH (SUTURE) IMPLANT
SUT VIC AB 0 CT1 18XCR BRD8 (SUTURE) ×6 IMPLANT
SUT VIC AB 0 CT1 27 (SUTURE) ×12
SUT VIC AB 0 CT1 27XBRD ANBCTR (SUTURE) ×8 IMPLANT
SUT VIC AB 0 CT1 8-18 (SUTURE) ×9
SUT VIC AB 2-0 CT1 27 (SUTURE)
SUT VIC AB 2-0 CT1 TAPERPNT 27 (SUTURE) IMPLANT
SUT VIC AB 2-0 SH 27 (SUTURE)
SUT VIC AB 2-0 SH 27XBRD (SUTURE) IMPLANT
SUT VIC AB 3-0 X1 27 (SUTURE) ×3 IMPLANT
SUT VIC AB 4-0 PS2 27 (SUTURE) IMPLANT
SUT VICRYL 0 TIES 12 18 (SUTURE) ×3 IMPLANT
SUT VICRYL 1 TIES 12X18 (SUTURE) ×3 IMPLANT
TOWEL OR 17X24 6PK STRL BLUE (TOWEL DISPOSABLE) ×6 IMPLANT
TRAY FOLEY CATH 14FR (SET/KITS/TRAYS/PACK) ×3 IMPLANT
WARMER LAPAROSCOPE (MISCELLANEOUS) ×3 IMPLANT
WATER STERILE IRR 1000ML POUR (IV SOLUTION) ×3 IMPLANT

## 2012-07-18 NOTE — Brief Op Note (Signed)
07/18/2012  11:55 AM  PATIENT:  Tabitha Christensen  37 y.o. female  PRE-OPERATIVE DIAGNOSIS:  CHRONIC PELVIC PAIN, ENDOMETRIOSIS  POST-OPERATIVE DIAGNOSIS:  CHRONIC PELVIC PAIN, ENDOMETRIOSIS  PROCEDURE:  Procedure(s): LAPAROSCOPIC ASSISTED VAGINAL HYSTERECTOMY (N/A)  SURGEON:  Surgeon(s) and Role:    * Miguel Aschoff, MD - Primary    * W Lodema Hong, MD - Assisting  ANESTHESIA:   general  EBL:  Total I/O In: -  Out: 175 [Urine:50; Blood:125]  BLOOD ADMINISTERED:none  DRAINS: Urinary Catheter (Foley)   LOCAL MEDICATIONS USED:  LIDOCAINE   SPECIMEN:  Source of Specimen:  uterus and cervix  DISPOSITION OF SPECIMEN:  PATHOLOGY  COUNTS:  YES  TOURNIQUET:  * No tourniquets in log *  DICTATION: .Other Dictation: Dictation Number [id number]  PLAN OF CARE: Admit for overnight observation  PATIENT DISPOSITION:  PACU - hemodynamically stable.

## 2012-07-18 NOTE — Progress Notes (Signed)
Pt requesting foley be discontinued, stated it was making her very uncomfortable.  Removed foley, ambulated pt to bathroom, she voided 250 clear yellow urine.  Pt states her pain is now minimal and she walked in the hall.  Pt also requests pca be d/c, d and would like to start po pain meds. Pt is eating and drinking w/o n/v.

## 2012-07-18 NOTE — H&P (Signed)
  Status unchanged will proceed with planned procedure. 

## 2012-07-18 NOTE — OR Nursing (Signed)
Time-out inadvertently recorded at 0744 instead of 1044.

## 2012-07-18 NOTE — Progress Notes (Signed)
700 ml LR INFUSED

## 2012-07-18 NOTE — Anesthesia Preprocedure Evaluation (Addendum)
Anesthesia Evaluation  Patient identified by MRN, date of birth, ID band Patient awake    Reviewed: Allergy & Precautions, H&P , NPO status , Patient's Chart, lab work & pertinent test results  Airway Mallampati: I TM Distance: >3 FB Neck ROM: full    Dental  (+) Missing   Pulmonary Current Smoker,  breath sounds clear to auscultation  Pulmonary exam normal       Cardiovascular negative cardio ROS  Rhythm:regular Rate:Normal     Neuro/Psych negative neurological ROS     GI/Hepatic negative GI ROS, Neg liver ROS,   Endo/Other  negative endocrine ROS  Renal/GU negative Renal ROS  negative genitourinary   Musculoskeletal negative musculoskeletal ROS (+)   Abdominal Normal abdominal exam  (+)   Peds  Hematology negative hematology ROS (+)   Anesthesia Other Findings   Reproductive/Obstetrics Chronic Pelvic Pain Endometriosis                         Anesthesia Physical Anesthesia Plan  ASA: II  Anesthesia Plan: General   Post-op Pain Management:    Induction: Intravenous  Airway Management Planned: Oral ETT  Additional Equipment:   Intra-op Plan:   Post-operative Plan: Extubation in OR  Informed Consent: I have reviewed the patients History and Physical, chart, labs and discussed the procedure including the risks, benefits and alternatives for the proposed anesthesia with the patient or authorized representative who has indicated his/her understanding and acceptance.   Dental advisory given  Plan Discussed with: Anesthesiologist, CRNA and Surgeon  Anesthesia Plan Comments:        Anesthesia Quick Evaluation

## 2012-07-18 NOTE — Transfer of Care (Signed)
Immediate Anesthesia Transfer of Care Note  Patient: Tabitha Christensen  Procedure(s) Performed: Procedure(s): LAPAROSCOPIC ASSISTED VAGINAL HYSTERECTOMY (N/A)  Patient Location: PACU  Anesthesia Type:General  Level of Consciousness: awake, alert  and oriented  Airway & Oxygen Therapy: Patient Spontanous Breathing and Patient connected to nasal cannula oxygen  Post-op Assessment: Report given to PACU RN and Post -op Vital signs reviewed and stable  Post vital signs: Reviewed and stable  Complications: No apparent anesthesia complications

## 2012-07-18 NOTE — Anesthesia Procedure Notes (Signed)
Procedure Name: Intubation Date/Time: 07/18/2012 10:40 AM Performed by: Graciela Husbands Pre-anesthesia Checklist: Suction available, Emergency Drugs available, Timeout performed, Patient identified and Patient being monitored Patient Re-evaluated:Patient Re-evaluated prior to inductionOxygen Delivery Method: Circle system utilized Intubation Type: IV induction Ventilation: Oral airway inserted - appropriate to patient size and Mask ventilation without difficulty Laryngoscope Size: Mac and 3 Grade View: Grade I Tube size: 7.0 mm Number of attempts: 1 Airway Equipment and Method: Stylet Placement Confirmation: ETT inserted through vocal cords under direct vision,  breath sounds checked- equal and bilateral and positive ETCO2 Secured at: 20 cm Tube secured with: Tape Dental Injury: Teeth and Oropharynx as per pre-operative assessment

## 2012-07-18 NOTE — Anesthesia Postprocedure Evaluation (Signed)
  Anesthesia Post-op Note  Patient: Tabitha Christensen  Procedure(s) Performed: Procedure(s): LAPAROSCOPIC ASSISTED VAGINAL HYSTERECTOMY (N/A)  Patient Location: [facility]  Anesthesia Type:General  Level of Consciousness: awake, alert  and oriented  Airway and Oxygen Therapy: Patient Spontanous Breathing and Patient connected to nasal cannula oxygen  Post-op Pain: none  Post-op Assessment: Post-op Vital signs reviewed and Patient's Cardiovascular Status Stable  Post-op Vital Signs: Reviewed and stable  Complications: No apparent anesthesia complications

## 2012-07-18 NOTE — Anesthesia Postprocedure Evaluation (Signed)
  Anesthesia Post-op Note  Patient: Tabitha Christensen  Procedure(s) Performed: Procedure(s): LAPAROSCOPIC ASSISTED VAGINAL HYSTERECTOMY (N/A)  Patient is awake and responsive. Pain and nausea are reasonably well controlled. Vital signs are stable and clinically acceptable. Oxygen saturation is clinically acceptable. There are no apparent anesthetic complications at this time. Patient is ready for discharge.

## 2012-07-19 ENCOUNTER — Encounter (HOSPITAL_COMMUNITY): Payer: Self-pay | Admitting: Obstetrics and Gynecology

## 2012-07-19 LAB — CBC
HCT: 33.8 % — ABNORMAL LOW (ref 36.0–46.0)
MCHC: 32.8 g/dL (ref 30.0–36.0)
RDW: 13.5 % (ref 11.5–15.5)

## 2012-07-19 NOTE — Progress Notes (Signed)
Patient ID: Tabitha Christensen, female   DOB: Mar 09, 1976, 37 y.o.   MRN: [id number]  POD #1/ Doing well after LAVH. NO problems to report other than mild lower back pain. Voiding and taking PO well  O: Afebrile  V/S Stable  Abdomen is soft non tender wounds healing well  Lab; Post op Hg 12.4  A: Excellent post op course  Plan: D/C home  Return to office in 4 weeks  Resume all prior meds  Percocet 5/325 prn pain  Nothing per vagina  Regular diet  To call for fever, severe pain or heavy bleeding.

## 2012-07-19 NOTE — Op Note (Signed)
NAMERENELL, COAXUM NO.:  [id number]  MEDICAL RECORD NO.:  [id number]  LOCATION:  9315                          FACILITY:  [facility]  PHYSICIAN:  Miguel Aschoff, M.D.       DATE OF BIRTH:  [birth year]  DATE OF PROCEDURE:  07/18/2012 DATE OF DISCHARGE:                              OPERATIVE REPORT   PREOPERATIVE DIAGNOSES:  Dysmenorrhea, dyspareunia, menorrhagia.  POSTOPERATIVE DIAGNOSES:  Dysmenorrhea, dyspareunia, menorrhagia.  PROCEDURE:  Laparoscopically assisted vaginal hysterectomy.  SURGEON:  Dr. Miguel Aschoff and Dr. Lodema Hong.  ANESTHESIA:  General.  COMPLICATIONS:  None.  JUSTIFICATION:  The patient is a 37 year old, white female with a history of chronic pelvic pain and progressively worsening dysmenorrhea, dyspareunia, and menorrhagia.  Attempts were made to control the patient's symptomatology conservatively, however, because of the persistence of the symptoms, she has requested that a definitive procedure be carried out to these problems.  The patient was therefore brought to the hospital at this time to undergo laparoscopically assisted vaginal hysterectomy, possible bilateral salpingo-oophorectomy, possible abdominal hysterectomy if indicated by any intra-abdominal pathology.  The risks and benefits of the procedure were discussed with the patient including hemorrhage, infection, blood clots, and injury to adjacent structures.  Informed consent has been obtained.  PROCEDURE IN DETAIL:  The patient was taken to the operating room, placed in supine position.  General anesthesia was administered without difficulty.  She was then placed in the modified lithotomy position, prepped, and draped in the usual sterile fashion.  Foley catheter was inserted.  A Hulka tenaculum was placed through the cervix and held for manipulation of the uterus.  Attention was then directed to the umbilicus where a small infraumbilical incision was made.  Veress needle was  inserted and the abdomen was insufflated with 3 L of CO2.  Following the insufflation, the trocar to laparoscope was placed followed by laparoscope itself.  Then under direct visualization, two accessory 5-mm ports were established in the left and right lower quadrants. Inspection revealed the anterior bladder peritoneum to be unremarkable. The uterus appeared to be top normal size.  The bladder peritoneum was unremarkable.  The cul-de-sac was unremarkable.  The tubes were inspected along the course and appeared to be normal except for areas where the patient has had a prior tubal sterilization.  The ovaries were within normal limits.  No evidence of endometriosis existed in the abdomen.  There was no unusual adhesion formation noted in the abdomen. Liver appeared to be normal.  The gallbladder somewhat distended, but otherwise appearing normal.  At this point, the __________ was introduced.  The utero-ovarian ligament was identified, grasped and fulgurated and then cut.  The round ligament was cut in a similar fashion as well as the fallopian tube after being cauterized. Dissection then continued along the right side and along the broad ligament using serial coagulations and cuts until the uterine vessels were reached.  The identical procedure was then carried out on the left side, again to the level of the uterine vessels.  At this point, it was elected to proceed with vaginal portion of the procedure.  The patient was placed in high lithotomy position.  The laparoscopic  instruments were removed except for the trocars, inspected, and was placed in the vaginal vault.  The anterior cervical lip was grasped with a tenaculum and then the cervical mucosa was injected with 1% lidocaine with epinephrine for hemostasis.  Then, the cervical mucosa was circumscribed and then dissected anteriorly and posteriorly until the peritoneal reflections were found.  The posterior peritoneum was then  entered.  A speculum was then placed through this.  Entry into the cul-de-sac, the uterosacral ligaments were identified, clamped with Heaney clamps, cut, and suture ligated using suture ligatures of 0 Vicryl.  The cardinal ligaments were clamped, cut, and suture ligated in a similar fashion. At this point, it was possible to enter the anterior peritoneum and then again using Haney clamps, the uterine vessels were found, clamped, cut, and suture ligated using suture ligatures of 0 Vicryl.  Then, additional bites were taken along the broad ligament structures, one on each side and this freed the specimen, the specimen consisting of the cervix and the uterus.  At this point, inspection was made for hemostasis. Hemostasis appeared to be excellent and then at this point, the posterior cuff was run using running interlocking 0 Vicryl suture.  Once this was done, the pelvic floor was re peritonealized using pursestring suture of 0 Vicryl.  After this was completed, the vaginal mucosa was reapproximated using running interlocking 0 Vicryl suture.  Uterosacral ligaments were ligated in the midline and sutured to each other for support of the vaginal cuff.  At this point, a final inspection was made at the vaginal portion of the operation.  Hemostasis appeared to be excellent.  The patient was then placed again in modified low lithotomy position.  The abdomen was re-insufflated and the trocars were still in place.  The laparoscope was then inserted and inspection was made to ensure that there was good hemostasis in the abdomen.  There was excellent hemostasis.  The abdomen was then irrigated with copious amounts of saline via the Nezhat suction irrigator.  Final inspection was made.  At this point, it was elected to complete the procedure.  The CO2 was allowed to escape as were all the instruments.  The small port sites were closed using 0 Vicryl and UR6 needle.  Then, the port sites were injected  with 0.25% Marcaine for postop analgesia.  The patient reversed from anesthetic and brought to recovery room in satisfactory condition.  Estimated blood loss from the procedure was 125 mL.  The patient tolerated the procedure well and went to recovery room in satisfactory condition.  Plan is for the patient to be observed overnight and assess in the morning for possible discharge.     Miguel Aschoff, M.D.     AR/MEDQ  D:  07/18/2012  T:  07/19/2012  Job:  [id number]

## 2012-07-24 ENCOUNTER — Other Ambulatory Visit: Payer: Self-pay | Admitting: Obstetrics and Gynecology

## 2012-07-25 NOTE — Discharge Summary (Signed)
Tabitha Christensen, Tabitha Christensen NO.:  [id number]  MEDICAL RECORD NO.:  [id number]  LOCATION:                                 FACILITY:  PHYSICIAN:  Miguel Aschoff, M.D.       DATE OF BIRTH:  May 30, 1975  DATE OF ADMISSION: DATE OF DISCHARGE:                              DISCHARGE SUMMARY   ADMISSION DIAGNOSES:  Menorrhagia, dysmenorrhea, pelvic pain.  FINAL DIAGNOSES:  Menorrhagia, dysmenorrhea, pelvic pain.  OPERATIONS AND PROCEDURES:  Laparoscopically-assisted vaginal hysterectomy.  BRIEF HISTORY:  The patient is a 37 year old, white female, with history of pelvic pain, dysmenorrhea, and dyspareunia, and worsening menses. The patient has had these problems treated conservatively in the past, but because of continued symptomatology, she has requested that a definitive procedure be carried out in an effort to control her pain and bleeding.  She was, therefore, brought to the hospital to undergo laparoscopically-assisted vaginal hysterectomy, possible bilateral salpingo-oophorectomy in effort to ensure that there is no intra- abdominal pathology contributing to the pain other than the uterus.  HOSPITAL COURSE:  Preoperative studies were obtained.  This included an admission hemoglobin of 13.5, hematocrit 41.5, white count of 5900.  PT and PTT were within normal limits as where her chemistry profile. Hospital course under general anesthesia on July 18, 2012, a laparoscopically-assisted vaginal hysterectomy was carried out without difficulty.  At the time of laparoscopy, there was no evidence of any intra-abdominal adhesions or endometriosis was also pathology because of the patient's symptoms was limited to the uterus and, therefore, only laparoscopically-assisted vaginal hysterectomy was carried out.  The procedure was carried out without difficulty.  The patient had an essentially uncomplicated postoperative course, tolerated increasing ambulation and diet well.  Her  postoperative hemoglobin fell to 11.1. She remained otherwise stable, and by the morning of July 19, 2012, was in satisfactory condition and will be discharged home.  Medications for home included Vicodin.  One every 4 hours as needed for pain.  She was instructed to place nothing in the vagina, to call if there are any problems such as fever, pain, or heavy bleeding.  She is to be seen back in 4 weeks for followup examination.  She is do no heavy lifting.  The pathology report on the hysterectomy specimen revealed the cervix to be unremarkable.  There was secretory endometrium with benign endometrial polyps and myometrium was unremarkable.  There was no definite evidence of endometriosis noted on the pathology specimen.  This also be noted that prior to this hysterectomy, the patient had, had a tubal sterilization and, therefore, did not consider this procedure should be one that would remove her fertility, since this had been previously taken care of at that time, for tubal sterilization.    Miguel Aschoff, M.D.    AR/MEDQ  D:  07/24/2012  T:  07/25/2012  Job:  [id number]

## 2019-09-10 ENCOUNTER — Other Ambulatory Visit (HOSPITAL_COMMUNITY): Payer: Self-pay | Admitting: Interventional Radiology

## 2019-09-10 DIAGNOSIS — I771 Stricture of artery: Secondary | ICD-10-CM

## 2019-09-14 ENCOUNTER — Ambulatory Visit (HOSPITAL_COMMUNITY): Admission: RE | Admit: 2019-09-14 | Payer: Self-pay | Source: Ambulatory Visit

## 2019-09-17 ENCOUNTER — Encounter: Payer: Self-pay | Admitting: Neurology

## 2019-09-21 ENCOUNTER — Ambulatory Visit (HOSPITAL_COMMUNITY)
Admission: RE | Admit: 2019-09-21 | Discharge: 2019-09-21 | Disposition: A | Discharge status: Home / Self Care | Payer: Self-pay | Source: Ambulatory Visit | Attending: Interventional Radiology | Admitting: Interventional Radiology

## 2019-09-21 ENCOUNTER — Other Ambulatory Visit: Payer: Self-pay

## 2019-09-21 DIAGNOSIS — I771 Stricture of artery: Secondary | ICD-10-CM

## 2019-09-21 NOTE — Consult Note (Signed)
Chief Complaint: Patient was seen in consultation today for intra-cranial stenosis  Supervising Physician: Julieanne Cotton  Patient Status: [facility] - Out-pt  History of Present Illness: Tabitha Christensen is a 44 y.o. female with no reported cardiopulmonary past medical history, but is a long-time and current smoker, presents for evaluation of left ICA occlusion/stenosis found on recent imaging during work-up for right hand weakness.  Patient reports that over the past 8+ months, she has had at least 3 instances of wakening from sleep with right hand weakness.  States "feels like it is asleep but it's not."  Typically after several minutes of massaging, the arm has returned to normal and she assumed it was positional during sleep.  She went for evaluation at her PCP who recommended physical therapy.  At her first physical therapy appointment, the therapist noticed decreased hand grip strength with right foot drop and sent her for MR Brain as well as stroke evaluation.  Eventually she was referred to the [facility] ED when work-up revealed that that she had in fact experienced a stroke. Further imaging showed abnormality in the L ICA and she is referred to [facility] for further evaluation and management of her stenosis.   No past medical history on file.  Past Surgical History:  Procedure Laterality Date  . LAPAROSCOPIC ASSISTED VAGINAL HYSTERECTOMY N/A 07/18/2012   Procedure: LAPAROSCOPIC ASSISTED VAGINAL HYSTERECTOMY;  Surgeon: Miguel Aschoff, MD;  Location: [facility];  Service: Gynecology;  Laterality: N/A;  . TUBAL LIGATION      Allergies: Codeine  Medications: Prior to Admission medications   Medication Sig Start Date End Date Taking? Authorizing Provider  HYDROcodone-acetaminophen (NORCO/VICODIN) 5-325 MG per tablet Take 1 tablet by mouth every 6 (six) hours as needed for pain.    [provider]     No family history on file.  Social History   Socioeconomic History  . Marital  status: Divorced    Spouse name: Not on file  . Number of children: Not on file  . Years of education: Not on file  . Highest education level: Not on file  Occupational History  . Not on file  Tobacco Use  . Smoking status: Current Every Day Smoker    Packs/day: 0.75    Years: 20.00    Pack years: 15.00    Types: Cigarettes  Substance and Sexual Activity  . Alcohol use: Yes    Comment: rare  . Drug use: No  . Sexual activity: Not on file  Other Topics Concern  . Not on file  Social History Narrative  . Not on file   Social Determinants of Health   Financial Resource Strain:   . Difficulty of Paying Living Expenses:   Food Insecurity:   . Worried About Programme researcher, broadcasting/film/video in the Last Year:   . Barista in the Last Year:   Transportation Needs:   . Freight forwarder (Medical):   Marland Kitchen Lack of Transportation (Non-Medical):   Physical Activity:   . Days of Exercise per Week:   . Minutes of Exercise per Session:   Stress:   . Feeling of Stress :   Social Connections:   . Frequency of Communication with Friends and Family:   . Frequency of Social Gatherings with Friends and Family:   . Attends Religious Services:   . Active Member of Clubs or Organizations:   . Attends Banker Meetings:   Marland Kitchen Marital Status:      Review of  Systems: A 12 point ROS discussed and pertinent positives are indicated in the HPI above.  All other systems are negative.  Review of Systems  Constitutional: Negative for fatigue and fever.  Respiratory: Negative for cough and shortness of breath.   Cardiovascular: Negative for chest pain.  Gastrointestinal: Negative for abdominal pain, diarrhea, nausea and vomiting.  Musculoskeletal: Positive for gait problem (R foot "catches"). Negative for back pain.  Neurological: Positive for weakness (R hand). Negative for dizziness, facial asymmetry, light-headedness, numbness and headaches.  Psychiatric/Behavioral: Negative for  behavioral problems and confusion.    Vital Signs: There were no vitals taken for this visit.  Physical Exam NAD, alert Neuro: Alert, awake, and oriented.  No facial droop.  Tongue midline.  Facial symmetry.  Speech intact.  EOMs intact. R upper extremity strength intact.  Hand grip strength decreased on right.  Mild right pronator drift. Decreased extension of digits of the right hand. Heel to toe intact, mild right foot drop  Imaging: No results found.  Labs:  CBC: No results for input(s): WBC, HGB, HCT, PLT in the last 8760 hours.  COAGS: No results for input(s): INR, APTT in the last 8760 hours.  BMP: No results for input(s): NA, K, CL, CO2, GLUCOSE, BUN, CALCIUM, CREATININE, GFRNONAA, GFRAA in the last 8760 hours.  Invalid input(s): CMP  LIVER FUNCTION TESTS: No results for input(s): BILITOT, AST, ALT, ALKPHOS, PROT, ALBUMIN in the last 8760 hours.  TUMOR MARKERS: No results for input(s): AFPTM, CEA, CA199, CHROMGRNA in the last 8760 hours.  Assessment and Plan: L ICA dissection/stenosis Patient with several month history of R hand/arm weakness which has extended as far as the right elbow at times.  Each time, the right hand has returned to baseline with exception of the most recent even in April at which time she developed right hand weakness which persists today.  She was initially sent for physical therapy, however therapist noted additional symptoms in stroke-like distribution.  She presents for [facility] consultation today for L ICA occlusion/stenosis.  It is likely that her symptoms present on exam today are due to her occlusion.  She would like to proceed with angiogram and intervention-- she would prefer this be a same-day process.  She has no cardiopulmonary history but has been a long-time smoker.  Currently smoking 2 packs per week and trying to quit.  Discussed the benefits of this.  Discussed procedure including risks, benefits, and recovery time.  She will need to  be on Plavix 300 mg x1, the 75 mg daily for a total of 7 days prior to her procedure.  She is agreeable to this.  She has been started on hyperlipidemia and hypertension medication after her stroke work-up.  She reports compliance with these.  Will have schedulers arrange appointment.  She prefers a Monday if possible due to availability of family support for the procedure.  Thank you for this interesting consult.  I greatly enjoyed meeting Breonia Kirstein Gila and look forward to participating in their care.  A copy of this report was sent to the requesting provider on this date.  Electronically Signed: Docia Barrier, PA 09/21/2019, 3:48 PM   I spent a total of  40 Minutes   in face to face in clinical consultation, greater than 50% of which was counseling/coordinating care for L ICA stenosis/occlusion.

## 2019-09-26 ENCOUNTER — Other Ambulatory Visit (HOSPITAL_COMMUNITY): Payer: Self-pay | Admitting: Interventional Radiology

## 2019-09-26 DIAGNOSIS — I771 Stricture of artery: Secondary | ICD-10-CM

## 2019-10-02 ENCOUNTER — Telehealth (HOSPITAL_COMMUNITY): Payer: Self-pay | Admitting: Radiology

## 2019-10-02 NOTE — Telephone Encounter (Signed)
Called pt, left VM for her to try to figure out a date that she can come pick up Brilinta samples and get her COVID testing done prior to scheduling her procedure with Deveshwar. JM

## 2019-10-03 ENCOUNTER — Telehealth (HOSPITAL_COMMUNITY): Payer: Self-pay | Admitting: Radiology

## 2019-10-03 NOTE — Telephone Encounter (Signed)
Called pt, left VM for her to call to schedule treatment of stenosis with Deveshwar. JM

## 2019-10-25 ENCOUNTER — Other Ambulatory Visit (HOSPITAL_COMMUNITY)
Admission: RE | Admit: 2019-10-25 | Discharge: 2019-10-25 | Disposition: A | Discharge status: Home / Self Care | Payer: HRSA Program | Source: Ambulatory Visit | Attending: Interventional Radiology | Admitting: Interventional Radiology

## 2019-10-25 DIAGNOSIS — Z20822 Contact with and (suspected) exposure to covid-19: Secondary | ICD-10-CM | POA: Diagnosis not present

## 2019-10-25 DIAGNOSIS — Z01812 Encounter for preprocedural laboratory examination: Secondary | ICD-10-CM | POA: Diagnosis present

## 2019-10-25 LAB — SARS CORONAVIRUS 2 (TAT 6-24 HRS): SARS Coronavirus 2: NEGATIVE

## 2019-10-26 ENCOUNTER — Other Ambulatory Visit: Payer: Self-pay | Admitting: Radiology

## 2019-10-26 ENCOUNTER — Other Ambulatory Visit: Payer: Self-pay

## 2019-10-26 ENCOUNTER — Other Ambulatory Visit (HOSPITAL_COMMUNITY): Payer: Self-pay | Admitting: Radiology

## 2019-10-26 ENCOUNTER — Encounter (HOSPITAL_COMMUNITY): Payer: Self-pay | Admitting: Interventional Radiology

## 2019-10-26 DIAGNOSIS — I771 Stricture of artery: Secondary | ICD-10-CM

## 2019-10-26 NOTE — Progress Notes (Signed)
Pt denies SOB, chest pain, and being under the care of a cardiologist. Pt denies having a stress test, echo and cardiac cath. Pt denies having a chest x ray. Pt stated that she recently had an EKG at [facility]. Nurse requested D/C Summary, EKG tracing, Labs, Chest x ray, and any cardiac studies; awaiting response. Pt made aware to stop taking  vitamins, fish oil and herbal medications. Do not take any NSAIDs ie: Ibuprofen, Advil, Naproxen (Aleve), Motrin, BC and Goody Powder. Victorino Dike, RN (IR), stated that she was already made aware that pt C/O blood in stool " the PA is working on it."  PT made aware to continue to quarantine. Pt verbalized understanding of all pre-op instructions.

## 2019-10-29 ENCOUNTER — Ambulatory Visit (HOSPITAL_COMMUNITY): Admission: RE | Admit: 2019-10-29 | Payer: Self-pay | Source: Home / Self Care | Admitting: Interventional Radiology

## 2019-10-29 ENCOUNTER — Ambulatory Visit (HOSPITAL_COMMUNITY): Admission: RE | Admit: 2019-10-29 | Payer: Self-pay | Source: Ambulatory Visit

## 2019-10-29 HISTORY — DX: Anxiety disorder, unspecified: F41.9

## 2019-10-29 HISTORY — DX: Headache, unspecified: R51.9

## 2019-10-29 HISTORY — DX: Carrier or suspected carrier of methicillin resistant Staphylococcus aureus: Z22.322

## 2019-10-29 HISTORY — DX: Unspecified perforation of tympanic membrane, unspecified ear: H72.90

## 2019-10-29 HISTORY — DX: Overactive bladder: N32.81

## 2019-10-29 HISTORY — DX: Diverticulitis of intestine, part unspecified, without perforation or abscess without bleeding: K57.92

## 2019-10-29 HISTORY — DX: Irritable bowel syndrome without diarrhea: K58.9

## 2019-10-29 HISTORY — DX: Essential (primary) hypertension: I10

## 2019-10-29 HISTORY — DX: Panic disorder (episodic paroxysmal anxiety): F41.0

## 2019-10-29 HISTORY — DX: Herpesviral infection, unspecified: B00.9

## 2019-10-29 HISTORY — DX: Spontaneous ecchymoses: R23.3

## 2019-10-29 HISTORY — DX: Depression, unspecified: F32.A

## 2019-10-29 HISTORY — DX: Anemia, unspecified: D64.9

## 2019-10-29 HISTORY — DX: Bipolar disorder, unspecified: F31.9

## 2019-10-29 HISTORY — DX: Irritable bowel syndrome, unspecified: K58.9

## 2019-10-29 HISTORY — DX: Other skin changes: R23.8

## 2019-10-29 HISTORY — DX: Cerebral infarction, unspecified: I63.9

## 2019-10-29 SURGERY — MRI WITH ANESTHESIA
Anesthesia: General

## 2019-10-31 ENCOUNTER — Telehealth (HOSPITAL_COMMUNITY): Payer: Self-pay | Admitting: Radiology

## 2019-10-31 NOTE — Telephone Encounter (Signed)
Called pt, left VM for her to call with an update on her recent visit with her PCP for rectal bleeding and discuss rescheduling her procedure with Deveshwar. JM

## 2019-11-01 LAB — PLATELET INHIBITION P2Y12: Platelet Function  P2Y12: 89 [PRU] — ABNORMAL LOW (ref 182–335)

## 2019-11-02 ENCOUNTER — Telehealth: Payer: Self-pay | Admitting: Student

## 2019-11-02 NOTE — Telephone Encounter (Signed)
Neurointerventional Radiology Brief Note:  Patient called to report that she had undergone evaluation for her rectal bleeding and no acute findings were discovered with her PCP.  She now has vaginal spotting. Her P2Y12 is 89 on Plavix 75mg  daily, aspirin 325 mg daily. PA presents for telephone consultation between Dr. and the patient.  Patient is s/p partial hysterectomy, post-menopausal.   Plan made to hold Plavix x1 week.  Patient to contact our office in 1 week with results of trial hold.   Corliss Skains, MS RD PA-C 2:40 PM

## 2019-11-17 ENCOUNTER — Other Ambulatory Visit (HOSPITAL_COMMUNITY)
Admission: RE | Admit: 2019-11-17 | Discharge: 2019-11-17 | Disposition: A | Discharge status: Home / Self Care | Payer: HRSA Program | Source: Ambulatory Visit | Attending: Interventional Radiology | Admitting: Interventional Radiology

## 2019-11-17 DIAGNOSIS — Z01812 Encounter for preprocedural laboratory examination: Secondary | ICD-10-CM | POA: Insufficient documentation

## 2019-11-17 DIAGNOSIS — Z20822 Contact with and (suspected) exposure to covid-19: Secondary | ICD-10-CM | POA: Insufficient documentation

## 2019-11-17 LAB — SARS CORONAVIRUS 2 (TAT 6-24 HRS): SARS Coronavirus 2: NEGATIVE

## 2019-11-19 ENCOUNTER — Other Ambulatory Visit (HOSPITAL_COMMUNITY): Payer: Self-pay

## 2019-11-19 DIAGNOSIS — I771 Stricture of artery: Secondary | ICD-10-CM

## 2019-11-19 LAB — PLATELET INHIBITION P2Y12: Platelet Function  P2Y12: 181 [PRU] — ABNORMAL LOW (ref 182–335)

## 2019-11-20 ENCOUNTER — Other Ambulatory Visit: Payer: Self-pay | Admitting: Student

## 2019-11-20 ENCOUNTER — Other Ambulatory Visit: Payer: Self-pay

## 2019-11-20 ENCOUNTER — Encounter (HOSPITAL_COMMUNITY): Payer: Self-pay | Admitting: Interventional Radiology

## 2019-11-20 ENCOUNTER — Other Ambulatory Visit: Payer: Self-pay | Admitting: Radiology

## 2019-11-20 NOTE — Progress Notes (Addendum)
Tabitha Christensen denies chest pain or shortness of breath. Patient  Tested negative for Covid on 11/17/19 and has been in quarantine since that time. Tabitha Christensen reports that she rountinely has to take Meclizine, "I have fluid in my ear and I get dizzy at times."  I asked Tabitha Christensen if she had an EKG done in the past year, patient reported maybe at [facility] ED. I requested records.

## 2019-11-20 NOTE — Anesthesia Preprocedure Evaluation (Addendum)
Anesthesia Evaluation  Patient identified by MRN, date of birth, ID band Patient awake    Reviewed: Allergy & Precautions, H&P , NPO status , Patient's Chart, lab work & pertinent test results  History of Anesthesia Complications (+) Family history of anesthesia reaction  Airway Mallampati: II  TM Distance: >3 FB Neck ROM: Full    Dental no notable dental hx. (+) Edentulous Upper, Partial Lower, Poor Dentition, Dental Advisory Given   Pulmonary Current Smoker and Patient abstained from smoking.,    Pulmonary exam normal breath sounds clear to auscultation       Cardiovascular Exercise Tolerance: Good hypertension, Pt. on medications and Pt. on home beta blockers  Rhythm:Regular Rate:Normal     Neuro/Psych  Headaches, Anxiety Depression Bipolar Disorder CVA    GI/Hepatic negative GI ROS, Neg liver ROS,   Endo/Other  negative endocrine ROS  Renal/GU negative Renal ROS  negative genitourinary   Musculoskeletal   Abdominal   Peds  Hematology  (+) Blood dyscrasia, anemia ,   Anesthesia Other Findings   Reproductive/Obstetrics negative OB ROS                           Anesthesia Physical Anesthesia Plan  ASA: III  Anesthesia Plan: General   Post-op Pain Management:    Induction: Intravenous  PONV Risk Score and Plan: 3 and Ondansetron, Dexamethasone and Midazolam  Airway Management Planned: Oral ETT  Additional Equipment: Arterial line  Intra-op Plan:   Post-operative Plan: Extubation in OR  Informed Consent: I have reviewed the patients History and Physical, chart, labs and discussed the procedure including the risks, benefits and alternatives for the proposed anesthesia with the patient or authorized representative who has indicated his/her understanding and acceptance.     Dental advisory given  Plan Discussed with: CRNA  Anesthesia Plan Comments:        Anesthesia  Quick Evaluation

## 2019-11-20 NOTE — H&P (Signed)
Chief Complaint: Patient was seen in consultation today for intracranial occlusion/stenosis  Referring Physician(s): Luanne Bras  Supervising Physician: Luanne Bras  Patient Status: [facility] - Out-pt  History of Present Illness: Tabitha Christensen is a 44 y.o. female with a medical history that includes bipolar disorder, depression/anxiety, IBS and tobacco abuse. She presented to her PCP with complaints of intermittent right-hand weakness for greater than 8 months. She was started on physical therapy and her therapist noticed a decreased hand grip strength and right foot drop. An MRI Brain and stroke evaluation was done 09/07/19 and she was sent to [facility] ED when the work-up revealed she had experienced a stroke. CTA Head and Neck 09/08/19 showed left ICA occlusion/stenosis with the possibility of dissection.  She was referred to [facility] for further evaluation and she met with Dr. Estanislado Pandy 09/21/19 for consultation. The decision was made to proceed with a diagnostic angiogram with possible intervention and she presents today for this procedure.   Past Medical History:  Diagnosis Date  . Anemia   . Anxiety   . Bipolar disorder ([place])   . Bruises easily   . Depression   . Diverticulitis   . Family history of adverse reaction to anesthesia    Mother hard to wake up  . Headache    HX: Migraines  . Herpes simplex    virus affects lips  . Hypertension   . IBS (irritable bowel syndrome)    with constipation  . MRSA carrier   . Overactive bladder   . Panic disorder   . Ruptured eardrum   . Stroke [facility])    " Mini"    Past Surgical History:  Procedure Laterality Date  . ABDOMINAL HYSTERECTOMY     partial  . COLONOSCOPY    . LAPAROSCOPIC ASSISTED VAGINAL HYSTERECTOMY N/A 07/18/2012   Procedure: LAPAROSCOPIC ASSISTED VAGINAL HYSTERECTOMY;  Surgeon: Gus Height, MD;  Location: [facility];  Service: Gynecology;  Laterality: N/A;  . MULTIPLE TOOTH EXTRACTIONS    . TUBAL LIGATION       Allergies: Augmentin [amoxicillin-pot clavulanate], Codeine, Metronidazole, and Citalopram  Medications: Prior to Admission medications   Medication Sig Start Date End Date Taking? Authorizing Provider  aspirin 325 MG EC tablet Take 325 mg by mouth daily.    [provider]  atenolol (TENORMIN) 25 MG tablet Take 25 mg by mouth daily. 10/13/19   [provider]  atorvastatin (LIPITOR) 80 MG tablet Take 40 mg by mouth at bedtime.  09/10/19   [provider]  clopidogrel (PLAVIX) 75 MG tablet Take 37.5 mg by mouth daily.  10/12/19   [provider]  gabapentin (NEURONTIN) 300 MG capsule Take 300-600 mg by mouth See admin instructions. Take 300 mg in the morning and 600 mg at bedtime    [provider]  meclizine (ANTIVERT) 25 MG tablet Take 25 mg by mouth 3 (three) times daily as needed for dizziness.    [provider]  QUEtiapine (SEROQUEL) 100 MG tablet Take 100 mg by mouth at bedtime.    [provider]     No family history on file.  Social History   Socioeconomic History  . Marital status: Divorced    Spouse name: Not on file  . Number of children: Not on file  . Years of education: Not on file  . Highest education level: Not on file  Occupational History  . Not on file  Tobacco Use  . Smoking status: Current Every Day Smoker  Packs/day: 0.50    Years: 20.00    Pack years: 10.00    Types: Cigarettes  . Smokeless tobacco: Never Used  . Tobacco comment: last time 11/19/2019  Vaping Use  . Vaping Use: Former  . Substances: Flavoring  Substance and Sexual Activity  . Alcohol use: Not Currently  . Drug use: No  . Sexual activity: Not Currently    Birth control/protection: Surgical  Other Topics Concern  . Not on file  Social History Narrative  . Not on file   Social Determinants of Health   Financial Resource Strain:   . Difficulty of Paying Living Expenses:   Food Insecurity:   . Worried About Paediatric nurse in the Last Year:   . Arboriculturist in the Last Year:   Transportation Needs:   . Film/video editor (Medical):   Marland Kitchen Lack of Transportation (Non-Medical):   Physical Activity:   . Days of Exercise per Week:   . Minutes of Exercise per Session:   Stress:   . Feeling of Stress :   Social Connections:   . Frequency of Communication with Friends and Family:   . Frequency of Social Gatherings with Friends and Family:   . Attends Religious Services:   . Active Member of Clubs or Organizations:   . Attends Archivist Meetings:   Marland Kitchen Marital Status:     Review of Systems: A 12 point ROS discussed and pertinent positives are indicated in the HPI above.  All other systems are negative.  Review of Systems  Constitutional: Negative for appetite change and fatigue.  Respiratory: Negative for cough and shortness of breath.   Cardiovascular: Negative for chest pain and leg swelling.  Gastrointestinal: Positive for constipation. Negative for abdominal pain, nausea and vomiting.  Musculoskeletal: Negative for back pain.  Neurological: Positive for weakness. Negative for dizziness and headaches.       Right hand, right leg    Vital Signs: LMP 06/23/2012  Temp: 97.7, BP: 126/77, HR: 61, RR:18, O2 Sats: 100% Room Air.   Physical Exam Constitutional:      General: She is not in acute distress. HENT:     Mouth/Throat:     Mouth: Mucous membranes are moist.     Pharynx: Oropharynx is clear.     Comments: Missing upper teeth. Many teeth missing on the bottom.  Cardiovascular:     Rate and Rhythm: Normal rate and regular rhythm.     Pulses: Normal pulses.     Heart sounds: Normal heart sounds.  Pulmonary:     Effort: Pulmonary effort is normal.     Breath sounds: Normal breath sounds.  Abdominal:     General: Bowel sounds are normal.     Palpations: Abdomen is soft.  Skin:    General: Skin is warm and dry.  Neurological:     Mental Status: She is alert and  oriented to person, place, and time.     Motor: Weakness present.     Gait: Gait abnormal.     Comments: Right hand and right leg/foot weakness. Unsteady gait per patient.      Imaging: No results found.  Labs:  CBC: Recent Labs    11/21/19 0651  WBC 10.0  HGB 13.1  HCT 41.7  PLT 318    COAGS: No results for input(s): INR, APTT in the last 8760 hours.  BMP: Recent Labs    11/21/19 0651  NA 141  K 3.3*  CL  105  CO2 26  GLUCOSE 99  BUN 5*  CALCIUM 8.8*  CREATININE 0.73  GFRNONAA >60  GFRAA >60    LIVER FUNCTION TESTS: No results for input(s): BILITOT, AST, ALT, ALKPHOS, PROT, ALBUMIN in the last 8760 hours.  TUMOR MARKERS: No results for input(s): AFPTM, CEA, CA199, CHROMGRNA in the last 8760 hours.  Assessment and Plan:  Left ICA stenosis/occlusion with possible dissection: Tabitha Christensen, 44 year old female, presents today to the [facility] Neurovascular Interventional Radiology department for an image-guided diagnostic cerebral angiogram with possible intervention. She has taken Plavix for the past 7 days in preparation for this procedure.   Risks and benefits of this procedure were discussed with the patient including, but not limited to bleeding, infection, vascular injury or contrast induced renal failure.  This interventional procedure involves the use of X-rays and because of the nature of the planned procedure, it is possible that we will have prolonged use of X-ray fluoroscopy.  Potential radiation risks to you include (but are not limited to) the following: - A slightly elevated risk for cancer  several years later in life. This risk is typically less than 0.5% percent. This risk is low in comparison to the normal incidence of human cancer, which is 33% for women and 50% for men according to the [employer]. - Radiation induced injury can include skin redness, resembling a rash, tissue breakdown / ulcers and hair loss (which can be  temporary or permanent).   The likelihood of either of these occurring depends on the difficulty of the procedure and whether you are sensitive to radiation due to previous procedures, disease, or genetic conditions.   IF your procedure requires a prolonged use of radiation, you will be notified and given written instructions for further action.  It is your responsibility to monitor the irradiated area for the 2 weeks following the procedure and to notify your physician if you are concerned that you have suffered a radiation induced injury.    All of the patient's questions were answered, patient is agreeable to proceed.  The patient has been NPO. Vitals have been reviewed. Labs are pending but will be reviewed prior to the start of the procedure.   Consent signed and in chart.  Thank you for this interesting consult.  I greatly enjoyed meeting Tabitha Christensen and look forward to participating in their care.  A copy of this report was sent to the requesting provider on this date.  Electronically Signed: Soyla Dryer, AGACNP-BC [phone] 11/21/2019, 8:16 AM   I spent a total of  30 Minutes   in face to face in clinical consultation, greater than 50% of which was counseling/coordinating care for diagnostic cerebral angiogram with possible intervention.

## 2019-11-21 ENCOUNTER — Observation Stay (HOSPITAL_COMMUNITY)
Admission: RE | Admit: 2019-11-21 | Discharge: 2019-11-21 | Disposition: A | Discharge status: Home / Self Care | Payer: Self-pay | Source: Ambulatory Visit | Attending: Interventional Radiology | Admitting: Interventional Radiology

## 2019-11-21 ENCOUNTER — Observation Stay (HOSPITAL_COMMUNITY)
Admission: RE | Admit: 2019-11-21 | Discharge: 2019-11-22 | Disposition: A | Discharge status: Home / Self Care | Payer: Self-pay | Attending: Interventional Radiology | Admitting: Interventional Radiology

## 2019-11-21 ENCOUNTER — Encounter (HOSPITAL_COMMUNITY): Payer: Self-pay | Admitting: Interventional Radiology

## 2019-11-21 ENCOUNTER — Encounter (HOSPITAL_COMMUNITY)
Admission: RE | Disposition: A | Discharge status: Home / Self Care | Payer: Self-pay | Source: Home / Self Care | Attending: Interventional Radiology

## 2019-11-21 ENCOUNTER — Inpatient Hospital Stay (HOSPITAL_COMMUNITY): Payer: Self-pay | Admitting: Certified Registered"

## 2019-11-21 DIAGNOSIS — Z90711 Acquired absence of uterus with remaining cervical stump: Secondary | ICD-10-CM | POA: Insufficient documentation

## 2019-11-21 DIAGNOSIS — I771 Stricture of artery: Secondary | ICD-10-CM

## 2019-11-21 DIAGNOSIS — Z880 Allergy status to penicillin: Secondary | ICD-10-CM | POA: Insufficient documentation

## 2019-11-21 DIAGNOSIS — F17210 Nicotine dependence, cigarettes, uncomplicated: Secondary | ICD-10-CM | POA: Insufficient documentation

## 2019-11-21 DIAGNOSIS — Z7902 Long term (current) use of antithrombotics/antiplatelets: Secondary | ICD-10-CM | POA: Insufficient documentation

## 2019-11-21 DIAGNOSIS — F319 Bipolar disorder, unspecified: Secondary | ICD-10-CM | POA: Insufficient documentation

## 2019-11-21 DIAGNOSIS — K589 Irritable bowel syndrome without diarrhea: Secondary | ICD-10-CM | POA: Insufficient documentation

## 2019-11-21 DIAGNOSIS — Z79899 Other long term (current) drug therapy: Secondary | ICD-10-CM | POA: Insufficient documentation

## 2019-11-21 DIAGNOSIS — Z8673 Personal history of transient ischemic attack (TIA), and cerebral infarction without residual deficits: Secondary | ICD-10-CM | POA: Insufficient documentation

## 2019-11-21 DIAGNOSIS — Z7982 Long term (current) use of aspirin: Secondary | ICD-10-CM | POA: Insufficient documentation

## 2019-11-21 DIAGNOSIS — Z8619 Personal history of other infectious and parasitic diseases: Secondary | ICD-10-CM | POA: Insufficient documentation

## 2019-11-21 DIAGNOSIS — M21371 Foot drop, right foot: Secondary | ICD-10-CM | POA: Insufficient documentation

## 2019-11-21 DIAGNOSIS — Z885 Allergy status to narcotic agent status: Secondary | ICD-10-CM | POA: Insufficient documentation

## 2019-11-21 DIAGNOSIS — I10 Essential (primary) hypertension: Secondary | ICD-10-CM | POA: Insufficient documentation

## 2019-11-21 DIAGNOSIS — I6522 Occlusion and stenosis of left carotid artery: Principal | ICD-10-CM | POA: Diagnosis present

## 2019-11-21 DIAGNOSIS — Z881 Allergy status to other antibiotic agents status: Secondary | ICD-10-CM | POA: Insufficient documentation

## 2019-11-21 HISTORY — PX: RADIOLOGY WITH ANESTHESIA: SHX6223

## 2019-11-21 HISTORY — PX: IR INTRAVSC STENT CERV CAROTID W/O EMB-PROT MOD SED INC ANGIO: IMG2304

## 2019-11-21 HISTORY — DX: Family history of other specified conditions: Z84.89

## 2019-11-21 HISTORY — PX: IR ANGIO VERTEBRAL SEL VERTEBRAL BILAT MOD SED: IMG5369

## 2019-11-21 LAB — POCT ACTIVATED CLOTTING TIME
Activated Clotting Time: 191 seconds
Activated Clotting Time: 180 seconds

## 2019-11-21 LAB — CBC WITH DIFFERENTIAL/PLATELET
Abs Immature Granulocytes: 0.02 10*3/uL (ref 0.00–0.07)
Basophils Absolute: 0 10*3/uL (ref 0.0–0.1)
Basophils Relative: 0 %
Eosinophils Absolute: 0.2 10*3/uL (ref 0.0–0.5)
Eosinophils Relative: 2 %
HCT: 41.7 % (ref 36.0–46.0)
Hemoglobin: 13.1 g/dL (ref 12.0–15.0)
Immature Granulocytes: 0 %
Lymphocytes Relative: 21 %
Lymphs Abs: 2.1 10*3/uL (ref 0.7–4.0)
MCH: 30.4 pg (ref 26.0–34.0)
MCHC: 31.4 g/dL (ref 30.0–36.0)
MCV: 96.8 fL (ref 80.0–100.0)
Monocytes Absolute: 0.6 10*3/uL (ref 0.1–1.0)
Monocytes Relative: 6 %
Neutro Abs: 7.1 10*3/uL (ref 1.7–7.7)
Neutrophils Relative %: 71 %
Platelets: 318 10*3/uL (ref 150–400)
RBC: 4.31 MIL/uL (ref 3.87–5.11)
RDW: 12.7 % (ref 11.5–15.5)
WBC: 10 10*3/uL (ref 4.0–10.5)
nRBC: 0 % (ref 0.0–0.2)

## 2019-11-21 LAB — MRSA PCR SCREENING: MRSA by PCR: POSITIVE — AB

## 2019-11-21 LAB — PROTIME-INR
INR: 1.1 (ref 0.8–1.2)
Prothrombin Time: 13.6 seconds (ref 11.4–15.2)

## 2019-11-21 LAB — BASIC METABOLIC PANEL
Anion gap: 10 (ref 5–15)
BUN: 5 mg/dL — ABNORMAL LOW (ref 6–20)
CO2: 26 mmol/L (ref 22–32)
Calcium: 8.8 mg/dL — ABNORMAL LOW (ref 8.9–10.3)
Chloride: 105 mmol/L (ref 98–111)
Creatinine, Ser: 0.73 mg/dL (ref 0.44–1.00)
GFR calc Af Amer: >60 mL/min (ref >60)
GFR calc non Af Amer: >60 mL/min (ref >60)
Glucose, Bld: 99 mg/dL (ref 70–99)
Potassium: 3.3 mmol/L — ABNORMAL LOW (ref 3.5–5.1)
Sodium: 141 mmol/L (ref 135–145)

## 2019-11-21 LAB — URINALYSIS, ROUTINE W REFLEX MICROSCOPIC
Bilirubin Urine: NEGATIVE
Glucose, UA: NEGATIVE mg/dL
Hgb urine dipstick: NEGATIVE
Ketones, ur: NEGATIVE mg/dL
Leukocytes,Ua: NEGATIVE
Nitrite: NEGATIVE
Protein, ur: NEGATIVE mg/dL
Specific Gravity, Urine: 1.021 (ref 1.005–1.030)
pH: 6 (ref 5.0–8.0)

## 2019-11-21 LAB — APTT: aPTT: 32 seconds (ref 24–36)

## 2019-11-21 LAB — HEPARIN LEVEL (UNFRACTIONATED): Heparin Unfractionated: <0.1 IU/mL — ABNORMAL LOW (ref 0.30–0.70)

## 2019-11-21 LAB — PLATELET INHIBITION P2Y12: Platelet Function  P2Y12: 176 [PRU] — ABNORMAL LOW (ref 182–335)

## 2019-11-21 SURGERY — IR WITH ANESTHESIA
Anesthesia: General

## 2019-11-21 MED ORDER — FENTANYL CITRATE (PF) 100 MCG/2ML IJ SOLN
INTRAMUSCULAR | Status: DC | PRN
  Administered 2019-11-21 (×2): 50 ug via INTRAVENOUS

## 2019-11-21 MED ORDER — SODIUM CHLORIDE 0.9 % IV SOLN: INTRAVENOUS | Status: DC

## 2019-11-21 MED ORDER — EPHEDRINE SULFATE-NACL 50-0.9 MG/10ML-% IV SOSY
PREFILLED_SYRINGE | INTRAVENOUS | Status: DC | PRN
  Administered 2019-11-21 (×6): 10 mg via INTRAVENOUS

## 2019-11-21 MED ORDER — DEXAMETHASONE SODIUM PHOSPHATE 10 MG/ML IJ SOLN
INTRAMUSCULAR | Status: DC | PRN
  Administered 2019-11-21: 10 mg via INTRAVENOUS

## 2019-11-21 MED ORDER — FENTANYL CITRATE (PF) 100 MCG/2ML IJ SOLN
INTRAMUSCULAR | Status: AC
  Filled 2019-11-21: qty 2

## 2019-11-21 MED ORDER — CLOPIDOGREL BISULFATE 75 MG PO TABS
75.0000 mg | ORAL_TABLET | Freq: Once | ORAL | Status: AC
  Administered 2019-11-21: 75 mg via ORAL
  Filled 2019-11-21: qty 1

## 2019-11-21 MED ORDER — HEPARIN (PORCINE) 25000 UT/250ML-% IV SOLN: 700.0000 [IU]/h | INTRAVENOUS | Status: DC

## 2019-11-21 MED ORDER — NITROGLYCERIN 1 MG/10 ML FOR IR/CATH LAB
INTRA_ARTERIAL | Status: AC
  Filled 2019-11-21: qty 10

## 2019-11-21 MED ORDER — QUETIAPINE FUMARATE 100 MG PO TABS
100.0000 mg | ORAL_TABLET | Freq: Every day | ORAL | Status: DC
  Administered 2019-11-21: 50 mg via ORAL
  Filled 2019-11-21: qty 1

## 2019-11-21 MED ORDER — CEFAZOLIN SODIUM-DEXTROSE 2-4 GM/100ML-% IV SOLN
2.0000 g | Freq: Once | INTRAVENOUS | Status: AC
  Administered 2019-11-21: 2 g via INTRAVENOUS

## 2019-11-21 MED ORDER — HEPARIN (PORCINE) 25000 UT/250ML-% IV SOLN
500.0000 [IU]/h | INTRAVENOUS | Status: DC
  Administered 2019-11-21: 500 [IU]/h via INTRAVENOUS

## 2019-11-21 MED ORDER — EPTIFIBATIDE 20 MG/10ML IV SOLN
INTRAVENOUS | Status: AC | PRN
  Administered 2019-11-21 (×4): 1.5 mg via INTRAVENOUS

## 2019-11-21 MED ORDER — CLOPIDOGREL BISULFATE 75 MG PO TABS
75.0000 mg | ORAL_TABLET | Freq: Once | ORAL | Status: DC
  Filled 2019-11-21: qty 1

## 2019-11-21 MED ORDER — FENTANYL CITRATE (PF) 100 MCG/2ML IJ SOLN: 25.0000 ug | INTRAMUSCULAR | Status: DC | PRN

## 2019-11-21 MED ORDER — CLEVIDIPINE BUTYRATE 0.5 MG/ML IV EMUL: 0.0000 mg/h | INTRAVENOUS | Status: DC

## 2019-11-21 MED ORDER — CEFAZOLIN SODIUM-DEXTROSE 1-4 GM/50ML-% IV SOLN
INTRAVENOUS | Status: AC
  Filled 2019-11-21: qty 50

## 2019-11-21 MED ORDER — GABAPENTIN 300 MG PO CAPS
600.0000 mg | ORAL_CAPSULE | Freq: Every day | ORAL | Status: DC
  Administered 2019-11-21: 600 mg via ORAL
  Filled 2019-11-21: qty 2

## 2019-11-21 MED ORDER — IOHEXOL 300 MG/ML  SOLN
150.0000 mL | Freq: Once | INTRAMUSCULAR | Status: AC | PRN
  Administered 2019-11-21: 66 mL via INTRA_ARTERIAL

## 2019-11-21 MED ORDER — CHLORHEXIDINE GLUCONATE CLOTH 2 % EX PADS: 6.0000 | MEDICATED_PAD | Freq: Every day | CUTANEOUS | Status: DC

## 2019-11-21 MED ORDER — ACETAMINOPHEN 500 MG PO TABS
1000.0000 mg | ORAL_TABLET | Freq: Once | ORAL | Status: AC
  Administered 2019-11-21: 1000 mg via ORAL
  Filled 2019-11-21: qty 2

## 2019-11-21 MED ORDER — CLOPIDOGREL BISULFATE 75 MG PO TABS: 75.0000 mg | ORAL_TABLET | Freq: Every day | ORAL | Status: DC

## 2019-11-21 MED ORDER — MIDAZOLAM HCL 2 MG/2ML IJ SOLN
INTRAMUSCULAR | Status: AC
  Filled 2019-11-21: qty 2

## 2019-11-21 MED ORDER — EPTIFIBATIDE 20 MG/10ML IV SOLN
INTRAVENOUS | Status: AC
  Filled 2019-11-21: qty 10

## 2019-11-21 MED ORDER — CHLORHEXIDINE GLUCONATE 0.12 % MT SOLN
15.0000 mL | Freq: Once | OROMUCOSAL | Status: AC
  Administered 2019-11-21: 15 mL via OROMUCOSAL

## 2019-11-21 MED ORDER — SUGAMMADEX SODIUM 200 MG/2ML IV SOLN
INTRAVENOUS | Status: DC | PRN
  Administered 2019-11-21: 200 mg via INTRAVENOUS

## 2019-11-21 MED ORDER — HEPARIN SODIUM (PORCINE) 1000 UNIT/ML IJ SOLN
INTRAMUSCULAR | Status: AC
  Filled 2019-11-21: qty 1

## 2019-11-21 MED ORDER — CLOPIDOGREL BISULFATE 75 MG PO TABS
75.0000 mg | ORAL_TABLET | Freq: Every day | ORAL | Status: DC
  Administered 2019-11-22: 75 mg via ORAL
  Filled 2019-11-21 (×2): qty 1

## 2019-11-21 MED ORDER — LIDOCAINE HCL 1 % IJ SOLN
INTRAMUSCULAR | Status: AC
  Filled 2019-11-21: qty 20

## 2019-11-21 MED ORDER — PROPOFOL 10 MG/ML IV BOLUS
INTRAVENOUS | Status: DC | PRN
  Administered 2019-11-21: 120 mg via INTRAVENOUS

## 2019-11-21 MED ORDER — ORAL CARE MOUTH RINSE: 15.0000 mL | Freq: Once | OROMUCOSAL | Status: AC

## 2019-11-21 MED ORDER — ACETAMINOPHEN 160 MG/5ML PO SOLN: 650.0000 mg | ORAL | Status: DC | PRN

## 2019-11-21 MED ORDER — SODIUM CHLORIDE (PF) 0.9 % IJ SOLN
INTRAVENOUS | Status: AC | PRN
  Administered 2019-11-21 (×2): 25 ug via INTRA_ARTERIAL

## 2019-11-21 MED ORDER — ATORVASTATIN CALCIUM 40 MG PO TABS
40.0000 mg | ORAL_TABLET | Freq: Every day | ORAL | Status: DC
  Administered 2019-11-21: 40 mg via ORAL
  Filled 2019-11-21: qty 1

## 2019-11-21 MED ORDER — GABAPENTIN 300 MG PO CAPS
300.0000 mg | ORAL_CAPSULE | Freq: Every day | ORAL | Status: DC
  Administered 2019-11-22: 300 mg via ORAL
  Filled 2019-11-21: qty 1

## 2019-11-21 MED ORDER — GLYCOPYRROLATE PF 0.2 MG/ML IJ SOSY
PREFILLED_SYRINGE | INTRAMUSCULAR | Status: DC | PRN
  Administered 2019-11-21: .2 mg via INTRAVENOUS

## 2019-11-21 MED ORDER — CLOPIDOGREL BISULFATE 75 MG PO TABS
ORAL_TABLET | ORAL | Status: AC
  Filled 2019-11-21: qty 1

## 2019-11-21 MED ORDER — MUPIROCIN 2 % EX OINT
1.0000 "application " | TOPICAL_OINTMENT | Freq: Two times a day (BID) | CUTANEOUS | Status: DC
  Administered 2019-11-21 – 2019-11-22 (×2): 1 via NASAL
  Filled 2019-11-21: qty 22

## 2019-11-21 MED ORDER — ATENOLOL 25 MG PO TABS
25.0000 mg | ORAL_TABLET | Freq: Every day | ORAL | Status: DC
  Administered 2019-11-22: 25 mg via ORAL
  Filled 2019-11-21: qty 1

## 2019-11-21 MED ORDER — LIDOCAINE 2% (20 MG/ML) 5 ML SYRINGE
INTRAMUSCULAR | Status: DC | PRN
  Administered 2019-11-21: 60 mg via INTRAVENOUS

## 2019-11-21 MED ORDER — HEPARIN SODIUM (PORCINE) 1000 UNIT/ML IJ SOLN
INTRAMUSCULAR | Status: DC | PRN
  Administered 2019-11-21: 500 [IU] via INTRAVENOUS
  Administered 2019-11-21: 2000 [IU] via INTRAVENOUS
  Administered 2019-11-21: 1000 [IU] via INTRAVENOUS

## 2019-11-21 MED ORDER — MIDAZOLAM HCL 2 MG/2ML IJ SOLN
INTRAMUSCULAR | Status: DC | PRN
  Administered 2019-11-21 (×2): 1 mg via INTRAVENOUS

## 2019-11-21 MED ORDER — PHENYLEPHRINE HCL-NACL 10-0.9 MG/250ML-% IV SOLN
INTRAVENOUS | Status: DC | PRN
  Administered 2019-11-21: 20 ug/min via INTRAVENOUS

## 2019-11-21 MED ORDER — IOHEXOL 300 MG/ML  SOLN
150.0000 mL | Freq: Once | INTRAMUSCULAR | Status: AC | PRN
  Administered 2019-11-21: 30 mL via INTRA_ARTERIAL

## 2019-11-21 MED ORDER — ASPIRIN 325 MG PO TABS
325.0000 mg | ORAL_TABLET | Freq: Every day | ORAL | Status: DC
  Administered 2019-11-22: 325 mg via ORAL
  Filled 2019-11-21: qty 1

## 2019-11-21 MED ORDER — NIMODIPINE 30 MG PO CAPS: 0.0000 mg | ORAL_CAPSULE | ORAL | Status: DC

## 2019-11-21 MED ORDER — ACETAMINOPHEN 650 MG RE SUPP: 650.0000 mg | RECTAL | Status: DC | PRN

## 2019-11-21 MED ORDER — ONDANSETRON HCL 4 MG/2ML IJ SOLN
INTRAMUSCULAR | Status: DC | PRN
  Administered 2019-11-21: 4 mg via INTRAVENOUS

## 2019-11-21 MED ORDER — LACTATED RINGERS IV SOLN: INTRAVENOUS | Status: DC

## 2019-11-21 MED ORDER — ASPIRIN 81 MG PO CHEW: 324.0000 mg | CHEWABLE_TABLET | Freq: Every day | ORAL | Status: DC

## 2019-11-21 MED ORDER — HEPARIN (PORCINE) 25000 UT/250ML-% IV SOLN
600.0000 [IU]/h | INTRAVENOUS | Status: DC
  Administered 2019-11-21: 600 [IU]/h via INTRAVENOUS

## 2019-11-21 MED ORDER — HEPARIN (PORCINE) 25000 UT/250ML-% IV SOLN
INTRAVENOUS | Status: AC
  Filled 2019-11-21: qty 250

## 2019-11-21 MED ORDER — ROCURONIUM BROMIDE 10 MG/ML (PF) SYRINGE
PREFILLED_SYRINGE | INTRAVENOUS | Status: DC | PRN
  Administered 2019-11-21: 40 mg via INTRAVENOUS

## 2019-11-21 MED ORDER — ONDANSETRON HCL 4 MG/2ML IJ SOLN: 4.0000 mg | Freq: Four times a day (QID) | INTRAMUSCULAR | Status: DC | PRN

## 2019-11-21 MED ORDER — ACETAMINOPHEN 325 MG PO TABS: 650.0000 mg | ORAL_TABLET | ORAL | Status: DC | PRN

## 2019-11-21 NOTE — Sedation Documentation (Signed)
Patient under care of anesthesia 

## 2019-11-21 NOTE — Transfer of Care (Signed)
Immediate Anesthesia Transfer of Care Note  Patient: Tabitha Christensen  Procedure(s) Performed: IR WITH ANESTHESIA STENTING (N/A )  Patient Location: PACU  Anesthesia Type:General  Level of Consciousness: awake, alert  and oriented  Airway & Oxygen Therapy: Patient Spontanous Breathing and Patient connected to nasal cannula oxygen  Post-op Assessment: Report given to RN and Post -op Vital signs reviewed and stable  Post vital signs: Reviewed and stable  Last Vitals:  Vitals Value Taken Time  BP 128/79 11/21/19 1246  Temp    Pulse 76 11/21/19 1248  Resp 12 11/21/19 1248  SpO2 100 % 11/21/19 1248  Vitals shown include unvalidated device data.  Last Pain:  Vitals:   11/21/19 0730  TempSrc: Temporal  PainSc:          Complications: No complications documented.

## 2019-11-21 NOTE — Progress Notes (Signed)
ANTICOAGULATION CONSULT NOTE -Follow Up Consult  Pharmacy Consult for heparin Indication: Neuro IR intervention  Allergies  Allergen Reactions  . Augmentin [Amoxicillin-Pot Clavulanate] Nausea And Vomiting    In high doses  . Codeine Nausea And Vomiting  . Metronidazole Nausea And Vomiting    "Caused a metal taste in my mouth"  . Citalopram Anxiety    Felt high     Patient Measurements: Height: 5\' 3"  (160 cm) Weight: 74.8 kg (165 lb) IBW/kg (Calculated) : 52.4  Heparin Dosing Weight:  68.2 kg  Vital Signs: Temp: 99 F (37.2 C) (08/18 1600) Temp Source: Oral (08/18 1600) BP: 95/45 (08/18 2100) Pulse Rate: 57 (08/18 2100)  Labs: Recent Labs    11/21/19 0651 11/21/19 2027  HGB 13.1  --   HCT 41.7  --   PLT 318  --   APTT 32  --   LABPROT 13.6  --   INR 1.1  --   HEPARINUNFRC  --  <0.10*  CREATININE 0.73  --     Estimated Creatinine Clearance: 87 mL/min (by C-G formula based on SCr of 0.73 mg/dL).   Medical History: Past Medical History:  Diagnosis Date  . Anemia   . Anxiety   . Bipolar disorder ([facility])   . Bruises easily   . Depression   . Diverticulitis   . Family history of adverse reaction to anesthesia    Mother hard to wake up  . Headache    HX: Migraines  . Herpes simplex    virus affects lips  . Hypertension   . IBS (irritable bowel syndrome)    with constipation  . MRSA carrier   . Overactive bladder   . Panic disorder   . Ruptured eardrum   . Stroke [facility])    " Mini"    Assessment: 44 yr old who presented as a code stroke was found to have left ICA occlusion/stenosis and underwent 4 vessel cerebral arteriogram. Pharmacy was consulted to manage IV heparin dosing post IR.  H/H, platelets WNL.  Initial heparin level ~6 hrs after pt was started on heparin infusion at 600 units/hr was <0.10 units/ml, which is below the goal range for this pt. Per RN, no issues with IV or bleeding observed.  Goal of Therapy:  Heparin level 0.1-0.25  units/ml Monitor platelets by anticoagulation protocol: Yes   Plan:  -Increase heparin infusion to 700 units/hr -F/U 6 hr heparin level -Monitor daily heparin level, CBC and signs/symptoms of bleeding  -Heparin to stop at 0700 tomorrow   05-26-1974, PharmD, BCPS, [facility] Clinical Pharmacist 11/21/19, 21:55 PM

## 2019-11-21 NOTE — Progress Notes (Addendum)
ANTICOAGULATION CONSULT NOTE - Initial Consult  Pharmacy Consult for heparin Indication: Neuro IR intervention  Allergies  Allergen Reactions  . Augmentin [Amoxicillin-Pot Clavulanate] Nausea And Vomiting    In high doses  . Codeine Nausea And Vomiting  . Metronidazole Nausea And Vomiting    "Caused a metal taste in my mouth"  . Citalopram Anxiety    Felt high     Patient Measurements: Height: 5\' 3"  (160 cm) Weight: 74.4 kg (164 lb) IBW/kg (Calculated) : 52.4  Vital Signs: Temp: 97.6 F (36.4 C) (08/18 1345) Temp Source: Temporal (08/18 0730) BP: 121/74 (08/18 1345) Pulse Rate: 58 (08/18 1345)  Labs: Recent Labs    11/21/19 0651  HGB 13.1  HCT 41.7  PLT 318  APTT 32  LABPROT 13.6  INR 1.1  CREATININE 0.73    Estimated Creatinine Clearance: 86.7 mL/min (by C-G formula based on SCr of 0.73 mg/dL).   Medical History: Past Medical History:  Diagnosis Date  . Anemia   . Anxiety   . Bipolar disorder (HCC)   . Bruises easily   . Depression   . Diverticulitis   . Family history of adverse reaction to anesthesia    Mother hard to wake up  . Headache    HX: Migraines  . Herpes simplex    virus affects lips  . Hypertension   . IBS (irritable bowel syndrome)    with constipation  . MRSA carrier   . Overactive bladder   . Panic disorder   . Ruptured eardrum   . Stroke [facility])    " Mini"    Medications:  Medications Prior to Admission  Medication Sig Dispense Refill Last Dose  . aspirin 325 MG EC tablet Take 325 mg by mouth daily.   11/21/2019 at 0530  . atenolol (TENORMIN) 25 MG tablet Take 25 mg by mouth daily.   11/21/2019 at 0530  . atorvastatin (LIPITOR) 80 MG tablet Take 40 mg by mouth at bedtime.    11/20/2019 at Unknown time  . clopidogrel (PLAVIX) 75 MG tablet Take 37.5 mg by mouth daily.    11/21/2019 at 0530  . gabapentin (NEURONTIN) 300 MG capsule Take 300-600 mg by mouth See admin instructions. Take 300 mg in the morning and 600 mg at bedtime    11/20/2019 at Unknown time  . meclizine (ANTIVERT) 25 MG tablet Take 25 mg by mouth 3 (three) times daily as needed for dizziness.   11/21/2019 at 0530  . QUEtiapine (SEROQUEL) 100 MG tablet Take 100 mg by mouth at bedtime.   11/20/2019 at Unknown time    Assessment: 19 YOF who presented as a code stroke found to have left ICA occlusion/ stenosis and underwent 4 vessel cerebral arteriogram. Pharmacy consulted to manage IV heparin dosing post IR.  H/H and Plt wnl.   Goal of Therapy:  Heparin level 0.1-0.25 units/ml Monitor platelets by anticoagulation protocol: Yes   Plan:  -Increase IV heparin slightly to 600 units/hr -F/u 6 hr HL -Monitor daily HL, CBC and s/s of bleeding  -Heparin to stop at 0700 tomorrow   05-26-1974, PharmD., BCPS, BCCCP Clinical Pharmacist Clinical phone for 11/21/19 until 3:30pm: [phone] If after 3:30pm, please refer to [facility] for unit-specific pharmacist

## 2019-11-21 NOTE — Procedures (Signed)
S/P LT proximal  ICA revascularization with stent assisted angioplasty with prox flow arrest. RT CFA approach . 4 vessel cerebral arteriogram.  Post procedure . 8 F angioseal closure device for hemostasis. RT groin soft . Distal,pulses  Palpable DPs and PTS bilaterally.  Patient extubated .  When alert denies any H/As N/V or visual changes.Speech and comprehension intact. Pupils 2 mm RT = LT sluggish. No facial asymmetry. Tongue mid line. MIld RT UE pronation drift. Motor.  RT UE 4+/5 prox and distally. RTv LE 4+/5 prox and distally. S.Jacorian Golaszewski MD

## 2019-11-21 NOTE — Anesthesia Postprocedure Evaluation (Signed)
Anesthesia Post Note  Patient: TRENDA CORLISS  Procedure(s) Performed: IR WITH ANESTHESIA STENTING (N/A )     Patient location during evaluation: PACU Anesthesia Type: General Level of consciousness: awake and alert Pain management: pain level controlled Vital Signs Assessment: post-procedure vital signs reviewed and stable Respiratory status: spontaneous breathing, nonlabored ventilation, respiratory function stable and patient connected to nasal cannula oxygen Cardiovascular status: blood pressure returned to baseline and stable Postop Assessment: no apparent nausea or vomiting Anesthetic complications: no   No complications documented.  Last Vitals:  Vitals:   11/21/19 0730 11/21/19 1245  BP: 126/77 128/79  Pulse: 61 73  Resp: 18 (!) 9  Temp: 36.5 C 36.4 C  SpO2: 100% 100%    Last Pain:  Vitals:   11/21/19 1245  TempSrc:   PainSc: 0-No pain                 Elizet Kaplan,W. EDMOND

## 2019-11-21 NOTE — Sedation Documentation (Addendum)
ACT 180 45fr angioseal to right groin arterial site by Dr. Corliss Skains

## 2019-11-21 NOTE — Anesthesia Procedure Notes (Signed)
Procedure Name: Intubation Date/Time: 11/21/2019 10:46 AM Performed by: Pearson Grippe, CRNA Pre-anesthesia Checklist: Patient identified, Emergency Drugs available, Suction available and Patient being monitored Patient Re-evaluated:Patient Re-evaluated prior to induction Oxygen Delivery Method: Circle system utilized Preoxygenation: Pre-oxygenation with 100% oxygen Induction Type: IV induction Ventilation: Mask ventilation without difficulty Laryngoscope Size: Miller and 2 Grade View: Grade I Tube type: Oral Tube size: 7.0 mm Number of attempts: 1 Airway Equipment and Method: Stylet Placement Confirmation: ETT inserted through vocal cords under direct vision,  positive ETCO2 and breath sounds checked- equal and bilateral Secured at: 21 cm Tube secured with: Tape Dental Injury: Teeth and Oropharynx as per pre-operative assessment

## 2019-11-21 NOTE — Progress Notes (Signed)
NIR.  Patient underwent an image-guided cerebral arteriogram with revascularization of proximal left ICA stenosis using stent assisted angioplasty via right femoral approach this AM by Dr. Corliss Skains.  Went to evaluate patient bedside alongside Dr. Corliss Skains. Patient awake and alert laying in bed with no complaints at this time. RN and patient's boyfriend at bedside.  Alert, awake, and oriented x3. Speech and comprehension intact. PERRL bilaterally. EOMs intact bilaterally without nystagmus or subjective diplopia. No facial asymmetry. Tongue midline. Can spontaneously move all extremities, right hand grip decreased compared to left hand grip but stronger than prior to procedure, RLE strength almost equal to LLE (improved since procedure). No pronator drift. Fine motor and coordination slow on right, improved from prior to procedure. Distal pulses (DPs, PTs) 2+ bilaterally. Right femoral puncture site soft without active bleeding or hematoma.  Plan to stay in neuro ICU for overnight observation. Patient counseled on smoking cessation. Advance diet as tolerated. IV Heparin until 0700 tomorrow. Soft neck collar to remain until 0700 tomorrow. SBP goal = 120-140. Continue taking Plavix 75 mg once daily and Aspirin 325 mg once daily. IR to follow.   Tabitha Boga Nikkole Placzek, PA-C 11/21/2019, 3:32 PM

## 2019-11-22 ENCOUNTER — Encounter (HOSPITAL_COMMUNITY): Payer: Self-pay | Admitting: Interventional Radiology

## 2019-11-22 LAB — CBC WITH DIFFERENTIAL/PLATELET
Abs Immature Granulocytes: 0.09 10*3/uL — ABNORMAL HIGH (ref 0.00–0.07)
Basophils Absolute: 0 10*3/uL (ref 0.0–0.1)
Basophils Relative: 0 %
Eosinophils Absolute: 0 10*3/uL (ref 0.0–0.5)
Eosinophils Relative: 0 %
HCT: 33.7 % — ABNORMAL LOW (ref 36.0–46.0)
Hemoglobin: 10.9 g/dL — ABNORMAL LOW (ref 12.0–15.0)
Immature Granulocytes: 1 %
Lymphocytes Relative: 9 %
Lymphs Abs: 1 10*3/uL (ref 0.7–4.0)
MCH: 31.3 pg (ref 26.0–34.0)
MCHC: 32.3 g/dL (ref 30.0–36.0)
MCV: 96.8 fL (ref 80.0–100.0)
Monocytes Absolute: 0.4 10*3/uL (ref 0.1–1.0)
Monocytes Relative: 3 %
Neutro Abs: 9.3 10*3/uL — ABNORMAL HIGH (ref 1.7–7.7)
Neutrophils Relative %: 87 %
Platelets: 300 10*3/uL (ref 150–400)
RBC: 3.48 MIL/uL — ABNORMAL LOW (ref 3.87–5.11)
RDW: 13 % (ref 11.5–15.5)
WBC: 10.8 10*3/uL — ABNORMAL HIGH (ref 4.0–10.5)
nRBC: 0 % (ref 0.0–0.2)

## 2019-11-22 LAB — BASIC METABOLIC PANEL
Anion gap: 6 (ref 5–15)
BUN: 6 mg/dL (ref 6–20)
CO2: 25 mmol/L (ref 22–32)
Calcium: 8.5 mg/dL — ABNORMAL LOW (ref 8.9–10.3)
Chloride: 109 mmol/L (ref 98–111)
Creatinine, Ser: 0.73 mg/dL (ref 0.44–1.00)
GFR calc Af Amer: >60 mL/min (ref >60)
GFR calc non Af Amer: >60 mL/min (ref >60)
Glucose, Bld: 201 mg/dL — ABNORMAL HIGH (ref 70–99)
Potassium: 3.7 mmol/L (ref 3.5–5.1)
Sodium: 140 mmol/L (ref 135–145)

## 2019-11-22 NOTE — Progress Notes (Signed)
Patient discharged to home with belongings and discharge instructions.

## 2019-11-22 NOTE — Discharge Summary (Signed)
Patient ID: JOETTE SCHMOKER MRN: [id number] DOB/AGE: [birth year] 44 y.o.  Admit date: 11/21/2019 Discharge date: 11/22/2019  Supervising Physician: Julieanne Cotton  Patient Status: [facility] - In-pt  Admission Diagnoses: Stenosis of left internal carotid artery  Discharge Diagnoses:  Active Problems:   Stenosis of left internal carotid artery   Discharged Condition: good  Hospital Course: Ms. Forney presented to [facility] on 11/21/19 for planned image guided cerebral angiogram with intervention under general anesthesia due to known left ICA occlusion. A stent was successfully placed without immediate complication and the patient was admitted to neuro ICU overnight for observation. She had an uneventful night and was seen this morning at bedside for follow up.   Ms. Hoppes states she feels well this morning, ate all of her breakfast besides the banana and has not had any nausea or vomiting. She has been able to ambulate independently to the bathroom and in the hall briefly with some slight imbalance which she attributes to the medications she was given. Per RN at bedside there is a slight drag to her right foot when ambulating. She feels that she is ready for discharge and has an adult, Casimiro Needle, at her home in [place] who is able to stay with her for several days. We discussed that she will need to see her PCP in 1 week and then follow up with our office in 2 weeks - we reviewed no driving, bending, lifting, stopping or lifting >10 lbs until she is seen for follow up. She was encouraged to continue taking her Plavix 75 mg PO QD + ASA 325 mg PO QD, continue smoking cessation, abstain from drugs or alcohol and increase her water intake. Return precautions discussed and patient states understanding to all of the above.   Consults: Anesthesia  Significant Diagnostic Studies: No results found.  Treatments: IV hydration, analgesia: acetaminophen, anticoagulation: Plavix, heparin  Discharge  Exam: Blood pressure (!) 92/51, pulse 88, temperature 98 F (36.7 C), temperature source Oral, resp. rate 18, height 5\' 3"  (1.6 m), weight 165 lb (74.8 kg), last menstrual period 06/23/2012, SpO2 100 %. Physical Exam Vitals and nursing note reviewed.  Constitutional:      General: She is not in acute distress.    Comments: Sitting up in bed, soft neck collar in place. Pleasant, talkative, answers questions appropriately. No visitors at bedside.  HENT:     Head: Normocephalic.  Cardiovascular:     Rate and Rhythm: Normal rate and regular rhythm.  Pulmonary:     Effort: Pulmonary effort is normal.     Breath sounds: Normal breath sounds.  Abdominal:     Palpations: Abdomen is soft.  Skin:    General: Skin is warm and dry.     Comments: (+) right CFA puncture site clean, dry, dressed appropraitely   Neurological:     Mental Status: She is alert.   Alert, awake, and oriented x 3 Speech and comprehension in tact PERRL bilaterally EOMs without obvious nystagmus or subjective diplopia. Visual fields grossly in tact No obvious facial asymmetry. Tongue midline. Motor power - 5/5 Left upper and lower extremities, 3+/5 right upper grip strength and slightly weaker on straight leg raise of right lower extremity -- consistent with post procedure exam yesterday No pronator drift. Fine motor and coordination slow on right still, appears improved from yesterday's post procedure exam Gait - not assessed during exam, per RN slight right foot drag but otherwise WNL Romberg - not assessed Heel to toe - not assessed Distal  pulses 2+ bilaterally   Disposition: Discharge disposition: 01-Home or Self Care        Allergies as of 11/22/2019      Reactions   Augmentin [amoxicillin-pot Clavulanate] Nausea And Vomiting   In high doses   Codeine Nausea And Vomiting   Metronidazole Nausea And Vomiting   "Caused a metal taste in my mouth"   Citalopram Anxiety   Felt high       Medication  List    TAKE these medications   aspirin 325 MG EC tablet Take 325 mg by mouth daily.   atenolol 25 MG tablet Commonly known as: TENORMIN Take 25 mg by mouth daily.   atorvastatin 80 MG tablet Commonly known as: LIPITOR Take 40 mg by mouth at bedtime.   clopidogrel 75 MG tablet Commonly known as: PLAVIX Take 37.5 mg by mouth daily.   gabapentin 300 MG capsule Commonly known as: NEURONTIN Take 300-600 mg by mouth See admin instructions. Take 300 mg in the morning and 600 mg at bedtime   meclizine 25 MG tablet Commonly known as: ANTIVERT Take 25 mg by mouth 3 (three) times daily as needed for dizziness.   QUEtiapine 100 MG tablet Commonly known as: SEROQUEL Take 100 mg by mouth at bedtime.       Follow-up Information    Julieanne Cotton, MD Follow up in 2 week(s).   Specialties: Interventional Radiology, Radiology Why: IR scheduler will call you with appointment date/time (about 2 weeks from your procedure). Please call with any questions or concerns prior to your appointment. Contact information: [address] [place] [address] [phone]                Electronically Signed: Villa Herb, PA-C 11/22/2019, 9:30 AM   I have spent Less Than 30 Minutes discharging Allanah Mcfarland [place].

## 2019-11-22 NOTE — Discharge Instructions (Signed)
Femoral Site Care °This sheet gives you information about how to care for yourself after your procedure. Your health care provider may also give you more specific instructions. If you have problems or questions, contact your health care provider. °What can I expect after the procedure? °After the procedure, it is common to have: °· Bruising that usually fades within 1-2 weeks. °· Tenderness at the site. °Follow these instructions at home: °Wound care °1. Follow instructions from your health care provider about how to take care of your insertion site. Make sure you: °? Wash your hands with soap and water before you change your bandage (dressing). If soap and water are not available, use hand sanitizer. °? Change your dressing as directed- pressure dressing removed 24 hours post-procedure (and switch for bandaid), bandaid removed 72 hours post-procedure °2. Do not take baths, swim, or use a hot tub for 7 days post-procedure. °3. You may shower 48 hours after the procedure or as told by your health care provider. °? Gently wash the site with plain soap and water. °? Pat the area dry with a clean towel. °? Do not rub the site. This may cause bleeding. °4. Check your femoral site every day for signs of infection. Check for: °? Redness, swelling, or pain. °? Fluid or blood. °? Warmth. °? Pus or a bad smell. °Activity °· Do not stoop, bend, or lift anything that is heavier than 10 lb (4.5 kg) for 2 weeks post-procedure. °· Do not drive self for 2 weeks post-procedure. °Contact a health care provider if you have: °· A fever or chills. °· You have redness, swelling, or pain around your insertion site. °Get help right away if: °· The catheter insertion area swells very fast. °· You pass out. °· You suddenly start to sweat or your skin gets clammy. °· The catheter insertion area is bleeding, and the bleeding does not stop when you hold steady pressure on the area. °· The area near or just beyond the catheter insertion site  becomes pale, cool, tingly, or numb. °These symptoms may represent a serious problem that is an emergency. Do not wait to see if the symptoms will go away. Get medical help right away. Call your local emergency services (911 in the [place] Do not drive yourself to the hospital. ° °This information is not intended to replace advice given to you by your health care provider. Make sure you discuss any questions you have with your health care provider. °Document Revised: 04/04/2017 Document Reviewed: 04/04/2017 °Elsevier Patient Education © 2020 [employer] °

## 2019-11-22 NOTE — Plan of Care (Signed)
Patient has been educated.

## 2019-11-29 ENCOUNTER — Encounter (HOSPITAL_COMMUNITY): Payer: Self-pay | Admitting: Radiology

## 2019-12-04 ENCOUNTER — Other Ambulatory Visit: Payer: Self-pay | Admitting: Radiology

## 2019-12-05 ENCOUNTER — Ambulatory Visit (HOSPITAL_COMMUNITY): Payer: Self-pay

## 2019-12-07 ENCOUNTER — Ambulatory Visit: Payer: Self-pay | Admitting: Neurology

## 2019-12-12 ENCOUNTER — Encounter (HOSPITAL_COMMUNITY): Payer: Self-pay

## 2019-12-12 NOTE — Addendum Note (Signed)
Encounter addended by: Herbie Baltimore on: 12/12/2019 9:56 AM  Actions taken: Imaging Exam ended

## 2019-12-12 NOTE — Addendum Note (Signed)
Encounter addended by: Kaisey Huseby J on: 12/12/2019 9:56 AM  Actions taken: Imaging Exam ended

## 2020-02-18 NOTE — Progress Notes (Signed)
NEUROLOGY CONSULTATION NOTE  Tabitha Christensen MRN: [id number] DOB: Jun 09, 1975  Referring provider: Maris Berger, MD Primary care provider: Maris Berger, MD  Reason for consult:  stroke   Subjective:  Tabitha Christensen is a 44 year old right-handed female with HTN, hypercholesterolemia and tobacco use who presents for stroke.  History supplemented by interventional radiology and referring provider's notes.  She is accompanied by her significant other.  Around end of March 2021, she developed right upper extremity weakness.  Her right arm/hand was clumsy and she had difficulty grasping.  Prior to that, she reports prior episodes of clumsiness, balance problems and facial tingling.  She was seen in the ED and diagnosed with probable nerve palsy.  She was referred to orthopedics and sent for occupational therapy.  The occupational therapist noticed that she seemed to have steppage gait on the right.  She then developed posterior headaches.  She had an MRI of the brain with and without contrast on 09/07/2019 which showed chronic hemorrhagic infarct and possible acute/subacute infarct in the left parietal lobe.  CTA of head and neck performed the following day demonstrated short segment severe near occlusive stenosis at the origin of the left ICA in the neck with a likely intraluminal thrombus with attenuation and small caliber left ICA distally to the level of the terminus.  Other atheromatous changes noted in the intracranial vessels including short-segment 50% stenosis at the origin of the right ICA.  She had a cerebral angiogram performed by Dr. Frederik Pear on 11/21/2019 where underwent left proximal ICA revascularization with stent assisted angioplasty.    She has not been able to go to occupational therapy due to insurance.  Her right hand is still not functioning properly and therefore is trying to apply for disability but has been denied.     Current medications:  ASA 325mg , Plavix 37.5mg  (she  was vaginal and rectal bleeding on 75mg ), atorvastatin 40mg  (80mg  caused difficulty functioning in conjunction with her Seroquel), atenolol  She smokes 1/2 ppd on average.  She is trying to quit.  PAST MEDICAL HISTORY: Past Medical History:  Diagnosis Date  . Anemia   . Anxiety   . Bipolar disorder (HCC)   . Bruises easily   . Depression   . Diverticulitis   . Family history of adverse reaction to anesthesia    Mother hard to wake up  . Headache    HX: Migraines  . Herpes simplex    virus affects lips  . Hypertension   . IBS (irritable bowel syndrome)    with constipation  . MRSA carrier   . Overactive bladder   . Panic disorder   . Ruptured eardrum   . Stroke [facility])    " Mini"    PAST SURGICAL HISTORY: Past Surgical History:  Procedure Laterality Date  . ABDOMINAL HYSTERECTOMY     partial  . COLONOSCOPY    . IR ANGIO VERTEBRAL SEL VERTEBRAL BILAT MOD SED  11/21/2019  . IR INTRAVSC STENT CERV CAROTID W/O EMB-PROT MOD SED INC ANGIO  11/21/2019  . LAPAROSCOPIC ASSISTED VAGINAL HYSTERECTOMY N/A 07/18/2012   Procedure: LAPAROSCOPIC ASSISTED VAGINAL HYSTERECTOMY;  Surgeon: [facility], MD;  Location: [facility] ORS;  Service: Gynecology;  Laterality: N/A;  . MULTIPLE TOOTH EXTRACTIONS    . RADIOLOGY WITH ANESTHESIA N/A 11/21/2019   Procedure: IR WITH ANESTHESIA STENTING;  Surgeon: 11/23/2019, MD;  Location: [facility] OR;  Service: Radiology;  Laterality: N/A;  . TUBAL LIGATION      MEDICATIONS: Current  Outpatient Medications on File Prior to Visit  Medication Sig Dispense Refill  . aspirin 325 MG EC tablet Take 325 mg by mouth daily.    Marland Kitchen atenolol (TENORMIN) 25 MG tablet Take 25 mg by mouth daily.    Marland Kitchen atorvastatin (LIPITOR) 80 MG tablet Take 40 mg by mouth at bedtime.     . clopidogrel (PLAVIX) 75 MG tablet Take 37.5 mg by mouth daily.     Marland Kitchen gabapentin (NEURONTIN) 300 MG capsule Take 300-600 mg by mouth See admin instructions. Take 300 mg in the morning and 600 mg at bedtime    .  meclizine (ANTIVERT) 25 MG tablet Take 25 mg by mouth 3 (three) times daily as needed for dizziness.    Marland Kitchen QUEtiapine (SEROQUEL) 100 MG tablet Take 100 mg by mouth at bedtime.     Current Facility-Administered Medications on File Prior to Visit  Medication Dose Route Frequency Provider Last Rate Last Admin  . lactated ringers infusion   Intravenous Continuous Mal Amabile, MD   Continued from Pre-op at 11/21/19 1610    ALLERGIES: Allergies  Allergen Reactions  . Augmentin [Amoxicillin-Pot Clavulanate] Nausea And Vomiting    In high doses  . Codeine Nausea And Vomiting  . Metronidazole Nausea And Vomiting    "Caused a metal taste in my mouth"  . Citalopram Anxiety    Felt high     FAMILY HISTORY: No family history on file.  SOCIAL HISTORY: Social History   Socioeconomic History  . Marital status: Divorced    Spouse name: Not on file  . Number of children: Not on file  . Years of education: Not on file  . Highest education level: Not on file  Occupational History  . Not on file  Tobacco Use  . Smoking status: Current Every Day Smoker    Packs/day: 0.50    Years: 20.00    Pack years: 10.00    Types: Cigarettes  . Smokeless tobacco: Never Used  . Tobacco comment: last time 11/19/2019  Vaping Use  . Vaping Use: Former  . Substances: Flavoring  Substance and Sexual Activity  . Alcohol use: Not Currently  . Drug use: No  . Sexual activity: Not Currently    Birth control/protection: Surgical  Other Topics Concern  . Not on file  Social History Narrative  . Not on file   Social Determinants of Health   Financial Resource Strain:   . Difficulty of Paying Living Expenses: Not on file  Food Insecurity:   . Worried About Programme researcher, broadcasting/film/video in the Last Year: Not on file  . Ran Out of Food in the Last Year: Not on file  Transportation Needs:   . Lack of Transportation (Medical): Not on file  . Lack of Transportation (Non-Medical): Not on file  Physical Activity:     . Days of Exercise per Week: Not on file  . Minutes of Exercise per Session: Not on file  Stress:   . Feeling of Stress : Not on file  Social Connections:   . Frequency of Communication with Friends and Family: Not on file  . Frequency of Social Gatherings with Friends and Family: Not on file  . Attends Religious Services: Not on file  . Active Member of Clubs or Organizations: Not on file  . Attends Banker Meetings: Not on file  . Marital Status: Not on file  Intimate Partner Violence:   . Fear of Current or Ex-Partner: Not on file  . Emotionally  Abused: Not on file  . Physically Abused: Not on file  . Sexually Abused: Not on file    Objective:  Blood pressure 127/61, pulse 60, height 5' (1.524 m), weight 155 lb (70.3 kg), last menstrual period 06/23/2012, SpO2 98 %. General: No acute distress.  Patient appears well-groomed.   Head:  Normocephalic/atraumatic Eyes:  fundi examined but not visualized Neck: supple, no paraspinal tenderness, full range of motion Back: No paraspinal tenderness Heart: regular rate and rhythm Lungs: Clear to auscultation bilaterally. Vascular: No carotid bruits. Neurological Exam: Mental status: alert and oriented to person, place, and time, recent and remote memory intact, fund of knowledge intact, attention and concentration intact, speech fluent and not dysarthric, language intact. Cranial nerves: CN I: not tested CN II: pupils equal, round and reactive to light, visual fields intact CN III, IV, VI:  full range of motion, no nystagmus, no ptosis CN V: facial sensation intact. CN VII: upper and lower face symmetric CN VIII: hearing intact CN IX, X: gag intact, uvula midline CN XI: sternocleidomastoid and trapezius muscles intact CN XII: tongue midline Bulk & Tone: normal, no fasciculations. Motor:  muscle strength 4+/5 interossei of right hand, 5-/5 finger flexion/extension of right hand, 5/5 right APB, 5-/5 right triceps, 5-/5  right hip flexion, otherwise 5/5 throughout.  Finger-thumb tapping speed and amplitude slow on right. Sensation:  Pinprick sensation reduced in right hand, vibratory sensation slightly reduced in right foot, otherwise intact. Deep Tendon Reflexes:  3+ right upper and lower extremities, 2+ left upper and lower extremities,  toes downgoing.   Finger to nose testing:  Slow on the right.  Heel to shin:  Without dysmetria.   Gait:  Slight right hemiparetic gait.  Difficulty tandem walk.  Romberg with sway.  Assessment/Plan:  1.  Left hemispheric/parietal infarct secondary to left ICA stenosis/thrombus 2.  Right sided hemiparesis as late effect of stroke 3.  Hypertension 4.  Hyperlipidemia 5.  Tobacco use disorder  1.  Secondary stroke prevention as managed by PCP and interventional endovascular radiology -  ASA 325mg  and Plavix.  Taking 37.5mg  of Plavix daily but Dr. post-op note states that she should be taking 75mg .  Advised patient to contact his office for clarification.   -  Atorvastatin.  LDL goal less than 70 -  Blood pressure control -  Glycemic control.  Hgb A1c goal less than 7 2.  Discussed risks of tobacco use and benefits of cessation 3.  Mediterranean diet 4.  Patient trying to get back into OT/PT.  I think this would be especially beneficial in her recovery. 5.  Follow up in 6 months.  Thank you for allowing me to take part in the care of this patient.  Fatima Sanger, DO  CC: , MD

## 2020-02-19 ENCOUNTER — Ambulatory Visit (INDEPENDENT_AMBULATORY_CARE_PROVIDER_SITE_OTHER): Payer: 59 | Admitting: Neurology

## 2020-02-19 ENCOUNTER — Encounter: Payer: Self-pay | Admitting: Neurology

## 2020-02-19 ENCOUNTER — Other Ambulatory Visit: Payer: Self-pay

## 2020-02-19 ENCOUNTER — Telehealth: Payer: Self-pay | Admitting: Student

## 2020-02-19 VITALS — BP 127/61 | HR 60 | Ht 60.0 in | Wt 155.0 lb

## 2020-02-19 DIAGNOSIS — I10 Essential (primary) hypertension: Secondary | ICD-10-CM

## 2020-02-19 DIAGNOSIS — I63232 Cerebral infarction due to unspecified occlusion or stenosis of left carotid arteries: Secondary | ICD-10-CM

## 2020-02-19 DIAGNOSIS — E785 Hyperlipidemia, unspecified: Secondary | ICD-10-CM | POA: Diagnosis not present

## 2020-02-19 DIAGNOSIS — I69351 Hemiplegia and hemiparesis following cerebral infarction affecting right dominant side: Secondary | ICD-10-CM | POA: Diagnosis not present

## 2020-02-19 DIAGNOSIS — F17200 Nicotine dependence, unspecified, uncomplicated: Secondary | ICD-10-CM

## 2020-02-19 NOTE — Patient Instructions (Signed)
1.  Continue aspirin 325mg  daily and Plavix.  Contact Dr. office to clarify if Plavix should be 37.5mg  or 75mg  daily 2.  Continue atorvastatin and atenolol 3.  Try working on quitting smoking 4.   Mediterranean diet (see below) 5.  Follow up in 6 months.

## 2020-02-19 NOTE — Telephone Encounter (Signed)
[facility].  History of revascularization of proximal left ICA stenosis using stent assisted angioplasty via right femoral approach 11/21/2019 by Dr. Corliss Skains.  Patient saw Dr. Everlena Cooper this AM. There was some confusion regarding DAPT dosing. Discussed case with Dr. Corliss Skains- patient to be taking Plavix 37.5 mg once daily and Aspirin 325 mg once daily if this had stopped her vaginal/rectal bleeding.  Called patient at 76- confirmed bleeding has stopped at this dose. Instructed patient to continue taking Plavix 37.5 mg once daily and Aspirin 325 mg once daily.  Please call [facility] with questions/concerns.   Waylan Boga Aarya Robinson, PA-C 02/19/2020, 1:57 PM

## 2020-03-06 NOTE — Progress Notes (Signed)
Disability forms received , waiting on Provider to fill out.

## 2020-03-10 ENCOUNTER — Encounter: Payer: Self-pay | Admitting: Podiatry

## 2020-03-10 ENCOUNTER — Other Ambulatory Visit: Payer: Self-pay

## 2020-03-10 ENCOUNTER — Ambulatory Visit: Payer: 59 | Admitting: Podiatry

## 2020-03-10 DIAGNOSIS — L603 Nail dystrophy: Secondary | ICD-10-CM | POA: Diagnosis not present

## 2020-03-10 DIAGNOSIS — M722 Plantar fascial fibromatosis: Secondary | ICD-10-CM | POA: Diagnosis not present

## 2020-03-10 DIAGNOSIS — M205X2 Other deformities of toe(s) (acquired), left foot: Secondary | ICD-10-CM

## 2020-03-10 DIAGNOSIS — M7751 Other enthesopathy of right foot: Secondary | ICD-10-CM | POA: Diagnosis not present

## 2020-03-10 MED ORDER — BETAMETHASONE SOD PHOS & ACET 6 (3-3) MG/ML IJ SUSP
9.0000 mg | Freq: Once | INTRAMUSCULAR | Status: AC
  Administered 2020-03-10: 9 mg via INTRA_ARTICULAR

## 2020-03-10 NOTE — Progress Notes (Signed)
  Subjective:  Patient ID: Tabitha Christensen, female    DOB: May 11, 1975,  MRN: [id number]  Chief Complaint  Patient presents with  . Callouses    i have some hard places on both little toe nails     44 y.o. female presents with the above complaint. History confirmed with patient. Would like to discuss removing both 5th toenails. Also has pain in the ball and heel of right foot. Denies prior treatments. States she can't cut the nails because they are so thick and painful.  Objective:  Physical Exam: warm, good capillary refill, no trophic changes or ulcerative lesions, normal DP and PT pulses and normal sensory exam. Left Foot: adductovarus 5th toe hammertoe with lister's corn.  Right Foot: POP right 3rd MPJ, medial calc tuber. 5th nail dystrophic  Assessment:   1. Capsulitis of metatarsophalangeal (MTP) joint of right foot   2. Plantar fasciitis of right foot   3. Nail dystrophy   4. Acquired adductovarus rotation of toe, left    Plan:  Patient was evaluated and treated and all questions answered.  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 Will discuss at later date.  Nail Dystrophy -Patient would like removal of both 5th toenails. Will discuss during surgical planning.  Capsulitis right 3rd MPJ -Educated on etiology -Injection delivered to the painful joint Procedure: Joint Injection Location: Right 3rd MPJ joint Skin Prep: Alcohol. Injectate: 0.5 cc 1% lidocaine plain, 0.5 cc celestone  Disposition: Patient tolerated procedure well. Injection site dressed with a band-aid.   Plantar fasciitis -Injection delivered to the plantar fascia of the right foot.  Procedure: Injection Tendon/Ligament Consent: Verbal consent obtained. Location: Right plantar fascia at the glabrous junction; medial approach. Skin Prep: Alcohol. Injectate: 1 cc 0.5% marcaine plain, 1 cc celestone Disposition: Patient tolerated procedure well. Injection site  dressed with a band-aid.  No follow-ups on file.

## 2020-04-10 ENCOUNTER — Ambulatory Visit: Payer: 59 | Admitting: Podiatry

## 2020-04-14 ENCOUNTER — Ambulatory Visit: Payer: 59 | Admitting: Podiatry

## 2020-04-14 ENCOUNTER — Other Ambulatory Visit: Payer: Self-pay

## 2020-04-14 DIAGNOSIS — M205X2 Other deformities of toe(s) (acquired), left foot: Secondary | ICD-10-CM | POA: Diagnosis not present

## 2020-04-14 DIAGNOSIS — L603 Nail dystrophy: Secondary | ICD-10-CM

## 2020-04-14 DIAGNOSIS — M7751 Other enthesopathy of right foot: Secondary | ICD-10-CM

## 2020-04-14 DIAGNOSIS — M722 Plantar fascial fibromatosis: Secondary | ICD-10-CM

## 2020-04-14 DIAGNOSIS — Z8673 Personal history of transient ischemic attack (TIA), and cerebral infarction without residual deficits: Secondary | ICD-10-CM

## 2020-04-14 DIAGNOSIS — D689 Coagulation defect, unspecified: Secondary | ICD-10-CM

## 2020-04-14 DIAGNOSIS — Q6689 Other  specified congenital deformities of feet: Secondary | ICD-10-CM

## 2020-04-14 NOTE — Progress Notes (Addendum)
  Subjective:  Patient ID: Tabitha Christensen, female    DOB: 1975/09/01,  MRN: [id number]  Chief Complaint  Patient presents with  . Callouses    Fu bilat corns and callus- needs triming  . capsulitis    Pt stas it is better - no swelling. 4/10 pain after extreme walking Tx: elevation   45 y.o. female presents with the above complaint. History confirmed with patient.  Objective:  Physical Exam: warm, good capillary refill, no trophic changes or ulcerative lesions, normal DP and PT pulses and normal sensory exam. Left Foot: adductovarus 5th toe hammertoe with lister's corn.  Right Foot: POP right 3rd MPJ, medial calc tuber. 5th nail dystrophic  Assessment:   1. Acquired adductovarus rotation of toe, left   2. Nail dystrophy   3. Capsulitis of metatarsophalangeal (MTP) joint of right foot   4. Plantar fasciitis of right foot   5. History of ischemic stroke   6. Coagulation defect [facility])    Plan:  Patient was evaluated and treated and all questions answered.  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 -Patient has failed all conservative therapy and wishes to proceed with surgical intervention. All risks, benefits, and alternatives discussed with patient. No guarantees given. Consent reviewed and signed by patient. -Planned procedures: correction 5th toe hammertoe -Identified risk factors: none -DME dispensed for post-op use: none  No follow-ups on file.

## 2020-06-09 ENCOUNTER — Encounter: Payer: 59 | Admitting: Podiatry

## 2020-06-26 ENCOUNTER — Encounter: Payer: 59 | Admitting: Podiatry

## 2020-06-30 ENCOUNTER — Telehealth: Payer: Self-pay

## 2020-06-30 NOTE — Telephone Encounter (Signed)
ERROR

## 2020-07-01 ENCOUNTER — Telehealth: Payer: Self-pay | Admitting: Urology

## 2020-07-01 NOTE — Telephone Encounter (Signed)
DOS: 07/09/20  EXC NAIL PERM B/L--- 11750 HAMMERTOE REAPAIR 5TH B/L --- 00459   Kerby Nora FROM [facility], CPT CODES 97741 AND [phone] APPROVED, AUTH # [id number] GOOD FROM 06/04/20-09/04/20

## 2020-07-07 ENCOUNTER — Telehealth: Payer: Self-pay | Admitting: *Deleted

## 2020-07-07 NOTE — Telephone Encounter (Signed)
Patient called on Thursday March 31-2022 and left a message asking if she needed to go off of her blood thinner before surgery and I called patient back and relayed the message per Dr Samuella Cota that she did not need to go off of the blood thinner. Tabitha Christensen

## 2020-07-09 ENCOUNTER — Encounter: Payer: Self-pay | Admitting: Podiatry

## 2020-07-14 ENCOUNTER — Ambulatory Visit: Payer: Self-pay | Admitting: Podiatry

## 2020-07-14 ENCOUNTER — Telehealth: Payer: Self-pay | Admitting: Podiatry

## 2020-07-14 NOTE — Telephone Encounter (Signed)
Ok - I didn't start the antibiotic but we can make her an appt for her Thursday to reschedule her surgery anyway. Can we call and make appt?

## 2020-07-14 NOTE — Telephone Encounter (Signed)
Patient states that she was put on an antibiotic after her surgery was canceled last week, calling to inform Dr. Samuella Cota that she is feeling very sick and thinks it may be from the antibiotic.

## 2020-07-28 ENCOUNTER — Encounter: Payer: Self-pay | Admitting: Podiatry

## 2020-08-19 NOTE — Progress Notes (Signed)
NEUROLOGY FOLLOW UP OFFICE NOTE  Tabitha Christensen [id number]  Assessment/Plan:   1.  Left hemispheric/parietal infarct secondary to left ICA stenosis/thrombus 2.  Hypertension 4.  Hyperlipidemia 5.  Tobacco use disorder  1.  Secondary stroke prevention medical management: - ASA 325mg  and Plavix 37.5mg  daily - Statin therapy.  LDL goal less than 70 - Normotensive blood pressure - Glycemic control.  Hgb A1c goal less than 7 2. Smoking cessation 3.  Will reach out to Dr. regarding follow up for patient 4.  Follow up 6 months.  Subjective:  Tabitha Christensen is a 45 year old right-handed female with HTN, hypercholesterolemia and tobacco use who follows up for stroke.  She is accompanied by her significant other.  UPDATE: Current medications:  ASA 325mg , Plavix 37.5mg  (she had vaginal and rectal bleeding on 75mg ), atorvastatin 40mg  BID (80mg  once daily caused difficulty functioning), atenolol  Still feeling weak.  Had to stop PT/OT because her car broke down and cannot get there.  She is doing her own exercises at home which has not been helpful.  Leg weakness has improved.  She cannot use her dominant hand to perform tasks such as writing.  She has not yet followed up with Dr. 59 of IR.  Still trying to quit smoking.  She usually smokes a pack over 3 days.  HISTORY: Around end of March 2021, she developed right upper extremity weakness.  Her right arm/hand was clumsy and she had difficulty grasping.  Prior to that, she reports prior episodes of clumsiness, balance problems and facial tingling.  She was seen in the ED and diagnosed with probable nerve palsy.  She was referred to orthopedics and sent for occupational therapy.  The occupational therapist noticed that she seemed to have steppage gait on the right.  She then developed posterior headaches.  She had an MRI of the brain with and without contrast on 09/07/2019 which showed chronic hemorrhagic infarct and possible  acute/subacute infarct in the left parietal lobe.  CTA of head and neck performed the following day demonstrated short segment severe near occlusive stenosis at the origin of the left ICA in the neck with a likely intraluminal thrombus with attenuation and small caliber left ICA distally to the level of the terminus.  Other atheromatous changes noted in the intracranial vessels including short-segment 50% stenosis at the origin of the right ICA.  She had a cerebral angiogram performed by Dr. on 11/21/2019 where underwent left proximal ICA revascularization with stent assisted angioplasty.    She has not been able to go to occupational therapy due to insurance.  Her right hand is still not functioning properly and therefore is trying to apply for disability but has been denied.      PAST MEDICAL HISTORY: Past Medical History:  Diagnosis Date  . Anemia   . Anxiety   . Bipolar disorder (HCC)   . Bruises easily   . Depression   . Diverticulitis   . Family history of adverse reaction to anesthesia    Mother hard to wake up  . Headache    HX: Migraines  . Herpes simplex    virus affects lips  . Hypertension   . IBS (irritable bowel syndrome)    with constipation  . MRSA carrier   . Overactive bladder   . Panic disorder   . Ruptured eardrum   . Stroke [facility])    " Mini"    MEDICATIONS: Current Outpatient Medications on File Prior  to Visit  Medication Sig Dispense Refill  . aspirin 325 MG EC tablet Take 325 mg by mouth daily.    Marland Kitchen atenolol (TENORMIN) 25 MG tablet Take 25 mg by mouth daily.    Marland Kitchen atorvastatin (LIPITOR) 40 MG tablet Take 40 mg by mouth at bedtime. (Patient not taking: Reported on 02/19/2020)    . clopidogrel (PLAVIX) 75 MG tablet Take 37.5 mg by mouth daily.     Marland Kitchen gabapentin (NEURONTIN) 300 MG capsule Take 300-600 mg by mouth See admin instructions. Take 300 mg in the morning and 600 mg at bedtime    . meclizine (ANTIVERT) 25 MG tablet Take 25 mg by mouth 3  (three) times daily as needed for dizziness.    Marland Kitchen QUEtiapine (SEROQUEL) 100 MG tablet Take 100 mg by mouth at bedtime.    . tamsulosin (FLOMAX) 0.4 MG CAPS capsule Take 0.4 mg by mouth daily.    . valACYclovir (VALTREX) 500 MG tablet Take 500 mg by mouth daily.     Current Facility-Administered Medications on File Prior to Visit  Medication Dose Route Frequency Provider Last Rate Last Admin  . lactated ringers infusion   Intravenous Continuous Mal Amabile, MD   Continued from Pre-op at 11/21/19 0973    ALLERGIES: Allergies  Allergen Reactions  . Augmentin [Amoxicillin-Pot Clavulanate] Nausea And Vomiting    In high doses  . Codeine Nausea And Vomiting  . Metronidazole Nausea And Vomiting    "Caused a metal taste in my mouth"  . Citalopram Anxiety    Felt high     FAMILY HISTORY: No family history on file.    Objective:  Blood pressure (!) 156/88, pulse 70, height 5' (1.524 m), weight 166 lb (75.3 kg), last menstrual period 06/23/2012, SpO2 99 %. General: No acute distress.  Patient appears wel-groomed.   Head:  Normocephalic/atraumatic Eyes:  Fundi examined but not visualized Neck: supple, no paraspinal tenderness, full range of motion Heart:  Regular rate and rhythm Lungs:  Clear to auscultation bilaterally Back: No paraspinal tenderness Neurological Exam: alert and oriented to person, place, and time. Attention span and concentration intact, recent and remote memory intact, fund of knowledge intact.  Speech fluent and not dysarthric, language intact.  CN II-XII intact. Motor:muscle strength 5-/5 interossei of right hand, 5/5 finger flexion/extension of right hand, 5/5 right APB, 5/5 right triceps, 5/5 right hip flexion, otherwise 5/5 throughout.  Finger-thumb tapping speed and amplitude slow on right. Sensation:Pinprick sensation reduced in right hand, vibratory sensation slightly reduced in right foot, otherwise intact.  DTRs:3+ right upper and lower extremities, 2+  left upper and lower extremities, toes downgoing. Finger to nose testing:Slow on the right. Heel to shin:Without dysmetria.  Gait:Steady.  Romberg negative.     Shon Millet, DO  CC: Maris Berger, MD

## 2020-08-20 ENCOUNTER — Encounter: Payer: Self-pay | Admitting: Neurology

## 2020-08-20 ENCOUNTER — Other Ambulatory Visit: Payer: Self-pay

## 2020-08-20 ENCOUNTER — Ambulatory Visit: Payer: Self-pay | Admitting: Neurology

## 2020-08-20 VITALS — BP 156/88 | HR 70 | Ht 60.0 in | Wt 166.0 lb

## 2020-08-20 DIAGNOSIS — F17200 Nicotine dependence, unspecified, uncomplicated: Secondary | ICD-10-CM

## 2020-08-20 DIAGNOSIS — I63232 Cerebral infarction due to unspecified occlusion or stenosis of left carotid arteries: Secondary | ICD-10-CM

## 2020-08-20 DIAGNOSIS — I6522 Occlusion and stenosis of left carotid artery: Secondary | ICD-10-CM

## 2020-08-20 DIAGNOSIS — E785 Hyperlipidemia, unspecified: Secondary | ICD-10-CM

## 2020-08-20 DIAGNOSIS — I10 Essential (primary) hypertension: Secondary | ICD-10-CM

## 2020-08-20 NOTE — Patient Instructions (Signed)
1.  Aspirin 325mg  daily and clopidogrel 37.5mg  daily 2.  Atorvastatin 40mg  twice daily 3.  Atenolol 4.  Try to work on quitting smoking 5.  Will reach out to Dr. office about follow up 6.  Follow up with me in 6 months.

## 2020-08-26 ENCOUNTER — Telehealth (HOSPITAL_COMMUNITY): Payer: Self-pay

## 2020-08-26 NOTE — Telephone Encounter (Signed)
Called to schedule us carotid, no answer, left vm. AW  

## 2020-12-23 ENCOUNTER — Telehealth (HOSPITAL_COMMUNITY): Payer: Self-pay

## 2020-12-23 ENCOUNTER — Other Ambulatory Visit (HOSPITAL_COMMUNITY): Payer: Self-pay | Admitting: Interventional Radiology

## 2020-12-23 DIAGNOSIS — I771 Stricture of artery: Secondary | ICD-10-CM

## 2020-12-23 NOTE — Telephone Encounter (Signed)
Called to schedule us carotid, no answer, left vm. AW  

## 2021-01-13 ENCOUNTER — Ambulatory Visit (HOSPITAL_COMMUNITY)
Admission: RE | Admit: 2021-01-13 | Discharge: 2021-01-13 | Disposition: A | Discharge status: Home / Self Care | Payer: 59 | Source: Ambulatory Visit | Attending: Interventional Radiology | Admitting: Interventional Radiology

## 2021-01-13 ENCOUNTER — Other Ambulatory Visit: Payer: Self-pay

## 2021-01-13 DIAGNOSIS — I771 Stricture of artery: Secondary | ICD-10-CM | POA: Diagnosis not present

## 2021-01-14 ENCOUNTER — Telehealth (HOSPITAL_COMMUNITY): Payer: Self-pay

## 2021-01-14 NOTE — Telephone Encounter (Signed)
Called pt regarding recent imaging, no answer, left vm. AW  

## 2021-01-15 ENCOUNTER — Telehealth (HOSPITAL_COMMUNITY): Payer: Self-pay

## 2021-01-15 NOTE — Telephone Encounter (Signed)
Pt agreed to f/u in 6 months with us carotid. AW 

## 2021-02-24 NOTE — Progress Notes (Signed)
NEUROLOGY FOLLOW UP OFFICE NOTE  Tabitha Christensen [id number]  Assessment/Plan:   Left hemispheric/parietal infarct secondary to left internal carotid artery stenosis/thrombosis Hypertension Hyperlipidemia Tobacco use disorder  Secondary stroke prevention as managed by PCP: ASA 325mg  and Plavix 37.5mg  daily Statin.  LDL goal less than 70 Normotensive blood pressure Glycemic control.  Hgb A1c goal less than 7 Smoking cessation Routine exercise Mediterranean diet Follow up as needed   Subjective:  Tabitha Christensen is a 45 year old right-handed female with HTN, hypercholesterolemia and tobacco use who follows up for stroke.  She is accompanied by her significant other.   UPDATE: Current medications:  ASA 325mg , Plavix 37.5mg  (she had vaginal and rectal bleeding on 75mg ), atorvastatin 40mg  BID (80mg  once daily caused difficulty functioning), atenolol  She was unable to get PT/OT due to cost.  She is doing her own exercises at home.  Carotid ultrasound on 01/13/2021 demonstrated patent left ICA stent and no hemodynamically significant right ICA stenosis.  Smoking a pack over 3 days.    HISTORY: Around end of March 2021, she developed right upper extremity weakness.  Her right arm/hand was clumsy and she had difficulty grasping.  Prior to that, she reports prior episodes of clumsiness, balance problems and facial tingling.  She was seen in the ED and diagnosed with probable nerve palsy.  She was referred to orthopedics and sent for occupational therapy.  The occupational therapist noticed that she seemed to have steppage gait on the right.  She then developed posterior headaches.  She had an MRI of the brain with and without contrast on 09/07/2019 which showed chronic hemorrhagic infarct and possible acute/subacute infarct in the left parietal lobe.  CTA of head and neck performed the following day demonstrated short segment severe near occlusive stenosis at the origin of the left ICA in the  neck with a likely intraluminal thrombus with attenuation and small caliber left ICA distally to the level of the terminus.  Other atheromatous changes noted in the intracranial vessels including short-segment 50% stenosis at the origin of the right ICA.  She had a cerebral angiogram performed by Dr. on 11/21/2019 where underwent left proximal ICA revascularization with stent assisted angioplasty.     She has not been able to go to occupational therapy due to insurance.  Her right hand is still not functioning properly and therefore is trying to apply for disability but has been denied.    PAST MEDICAL HISTORY: Past Medical History:  Diagnosis Date   Anemia    Anxiety    Bipolar disorder (HCC)    Bruises easily    Depression    Diverticulitis    Family history of adverse reaction to anesthesia    Mother hard to wake up   Headache    HX: Migraines   Herpes simplex    virus affects lips   Hypertension    IBS (irritable bowel syndrome)    with constipation   MRSA carrier    Overactive bladder    Panic disorder    Ruptured eardrum    Stroke (HCC)    " Mini"    MEDICATIONS: Current Outpatient Medications on File Prior to Visit  Medication Sig Dispense Refill   aspirin 325 MG EC tablet Take 325 mg by mouth daily.     atenolol (TENORMIN) 25 MG tablet Take 25 mg by mouth daily.     atorvastatin (LIPITOR) 40 MG tablet Take 40 mg by mouth at bedtime.  clopidogrel (PLAVIX) 75 MG tablet Take 37.5 mg by mouth daily.      gabapentin (NEURONTIN) 300 MG capsule Take 300-600 mg by mouth See admin instructions. Take 100 mg in the morning and 200 mg in the evening.     meclizine (ANTIVERT) 25 MG tablet Take 25 mg by mouth 3 (three) times daily as needed for dizziness.     QUEtiapine (SEROQUEL) 100 MG tablet Take 100 mg by mouth at bedtime.     tamsulosin (FLOMAX) 0.4 MG CAPS capsule Take 0.4 mg by mouth daily. (Patient not taking: Reported on 08/20/2020)     valACYclovir (VALTREX)  500 MG tablet Take 500 mg by mouth daily.     Current Facility-Administered Medications on File Prior to Visit  Medication Dose Route Frequency Provider Last Rate Last Admin   lactated ringers infusion   Intravenous Continuous Mal Amabile, MD   Continued from Pre-op at 11/21/19 0933    ALLERGIES: Allergies  Allergen Reactions   Augmentin [Amoxicillin-Pot Clavulanate] Nausea And Vomiting    In high doses   Codeine Nausea And Vomiting   Metronidazole Nausea And Vomiting    "Caused a metal taste in my mouth"   Citalopram Anxiety    Felt high     FAMILY HISTORY: No family history on file.    Objective:  Blood pressure 136/87, pulse (!) 59, height 5\' 2"  (1.575 m), weight 159 lb 12.8 oz (72.5 kg), last menstrual period 06/23/2012, SpO2 100 %. General: No acute distress.  Patient appears well-groomed.   Head:  Normocephalic/atraumatic Eyes:  Fundi examined but not visualized Neck: supple, no paraspinal tenderness, full range of motion Heart:  Regular rate and rhythm Lungs:  Clear to auscultation bilaterally Back: No paraspinal tenderness Neurological Exam: alert and oriented to person, place, and time.  Speech fluent and not dysarthric, language intact.  CN II-XII intact. Bulk and tone normal, muscle strength  5-/5 interossei of right hand, 5/5 finger flexion/extension of right hand, 5/5 right APB, 5/5 right triceps, 5-/5 right hip flexion, otherwise 5/5 throughout.  Finger-thumb tapping speed and amplitude slow on right.  Sensation to pinprick sensation reduced in right hand, vibratory sensation slightly reduced in right foot, otherwise intact. .  Deep tendon reflexes 3+ right upper and lower extremities, 2+ left upper and lower extremities,  toes downgoing.  Finger to nose testing Slow on the right.  Gait steady, Romberg negative.   06/25/2012, DO  CC: Tabitha Millet, MD

## 2021-02-25 ENCOUNTER — Encounter: Payer: Self-pay | Admitting: Neurology

## 2021-02-25 ENCOUNTER — Ambulatory Visit: Payer: 59 | Admitting: Neurology

## 2021-02-25 ENCOUNTER — Other Ambulatory Visit: Payer: Self-pay

## 2021-02-25 VITALS — BP 136/87 | HR 59 | Ht 62.0 in | Wt 159.8 lb

## 2021-02-25 DIAGNOSIS — I63232 Cerebral infarction due to unspecified occlusion or stenosis of left carotid arteries: Secondary | ICD-10-CM | POA: Diagnosis not present

## 2021-02-25 DIAGNOSIS — E785 Hyperlipidemia, unspecified: Secondary | ICD-10-CM

## 2021-02-25 DIAGNOSIS — I10 Essential (primary) hypertension: Secondary | ICD-10-CM | POA: Diagnosis not present

## 2021-02-25 DIAGNOSIS — I6522 Occlusion and stenosis of left carotid artery: Secondary | ICD-10-CM

## 2021-02-25 DIAGNOSIS — F17200 Nicotine dependence, unspecified, uncomplicated: Secondary | ICD-10-CM

## 2021-02-25 NOTE — Patient Instructions (Addendum)
Continue aspirin 325mg  and Plavix 37.5mg  daily Continue atorvastatin Continue blood pressure control Continue working on quitting smoking Mediterranean diet Annual carotid dopplers 7.  Secondary stroke prevention to be managed by PCP

## 2021-09-09 ENCOUNTER — Other Ambulatory Visit (HOSPITAL_COMMUNITY): Payer: Self-pay | Admitting: Interventional Radiology

## 2021-09-09 DIAGNOSIS — I771 Stricture of artery: Secondary | ICD-10-CM

## 2021-09-23 ENCOUNTER — Ambulatory Visit (HOSPITAL_COMMUNITY)
Admission: RE | Admit: 2021-09-23 | Discharge: 2021-09-23 | Disposition: A | Discharge status: Home / Self Care | Payer: 59 | Source: Ambulatory Visit | Attending: Interventional Radiology | Admitting: Interventional Radiology

## 2021-09-23 DIAGNOSIS — I771 Stricture of artery: Secondary | ICD-10-CM | POA: Diagnosis not present

## 2021-10-07 ENCOUNTER — Telehealth (HOSPITAL_COMMUNITY): Payer: Self-pay

## 2021-10-07 NOTE — Telephone Encounter (Signed)
Pt agreed to f/u in 6 months with a us carotid. AW 

## 2021-10-07 NOTE — Telephone Encounter (Signed)
Called pt regarding recent imaging, no answer, left vm. AW  

## 2022-01-01 ENCOUNTER — Ambulatory Visit: Payer: Commercial Managed Care - HMO

## 2022-01-01 ENCOUNTER — Ambulatory Visit: Payer: Commercial Managed Care - HMO | Admitting: Podiatry

## 2022-01-01 DIAGNOSIS — M779 Enthesopathy, unspecified: Secondary | ICD-10-CM | POA: Diagnosis not present

## 2022-01-01 DIAGNOSIS — M722 Plantar fascial fibromatosis: Secondary | ICD-10-CM | POA: Diagnosis not present

## 2022-01-01 NOTE — Patient Instructions (Signed)

## 2022-01-01 NOTE — Progress Notes (Unsigned)
  Subjective:  Patient ID: Tabitha Christensen, female    DOB: 1975/06/16,  MRN: [id number]  Chief Complaint  Patient presents with   Foot Pain    Plantar fasciitis    46 y.o. female presents with the above complaint. ***   Review of Systems: Negative except as noted in the HPI. Denies N/V/F/Ch.   Objective:  There were no vitals filed for this visit. There is no height or weight on file to calculate BMI. Constitutional Well developed. Well nourished.  Vascular Dorsalis pedis pulses palpable bilaterally. Posterior tibial pulses palpable bilaterally. Capillary refill normal to all digits.  No cyanosis or clubbing noted. Pedal hair growth normal.  Neurologic Normal speech. Oriented to person, place, and time. Epicritic sensation to light touch grossly present bilaterally.  Dermatologic Nails well groomed and normal in appearance. No open wounds. No skin lesions.  Orthopedic: Normal joint ROM without pain or crepitus bilaterally. No visible deformities. Tender to palpation at the calcaneal tuber {Left/right:33004}. No pain with calcaneal squeeze {Left/right:33004}. Ankle ROM {Range of motion:16529} {Left/right:33004}. Silfverskiold Test: {Desc; negative/positive:13464::"negative"} {Left/right:33004}.   Radiographs: Taken and reviewed. No acute fractures or dislocations. No evidence of stress fracture.  Plantar heel spur {DESC; ABSENT OR PRESENT:19119}. Posterior heel spur {DESC; ABSENT OR PRESENT:19119}. ***  Assessment:   1. Capsulitis   2. Plantar fasciitis, bilateral    Plan:  Patient was evaluated and treated and all questions answered.  Plantar Fasciitis, bilaterally - XR reviewed as above.  - Educated on icing and stretching. Instructions given.  - Injection delivered to the plantar fascia as below. - DME: Power step orthotics  Procedure: Injection Tendon/Ligament Location: Bilateral plantar fascia at the glabrous junction; medial approach. Skin Prep:  alcohol Injectate: 1 cc 0.5% marcaine plain, 1 cc kenalog 10. Disposition: Patient tolerated procedure well. Injection site dressed with a band-aid.  Return in about 6 weeks (around 02/12/2022) for Bilateral PF.

## 2022-02-12 ENCOUNTER — Ambulatory Visit: Payer: Commercial Managed Care - HMO | Admitting: Podiatry

## 2022-02-12 DIAGNOSIS — M722 Plantar fascial fibromatosis: Secondary | ICD-10-CM | POA: Diagnosis not present

## 2022-02-12 NOTE — Progress Notes (Signed)
  Subjective:  Patient ID: Tabitha Christensen, female    DOB: Apr 29, 1975,  MRN: [id number]  Chief Complaint  Patient presents with   Follow-up    6 week for bilateral plantar fasciitis. Patient is feeling a little better but has some pain. Patient is not using powerstep because stated her feet tend to swell and does not fit in her shoe because is tight.     46 y.o. female presents for follow-up of bilateral plantar fasciitis.  She says she is having decreased pain especially in the right heel.  Still having some tenderness in the left heel.  It is overall improved from prior.  She had an injection into both heels at last visit.  Says this has been very helpful.  She also was dispensed power steps but has not been able to wear them as they do not fit in her current shoes.  She is planning to get some new shoes soon that we will accommodate these power step orthotics and will then begin wearing.  Review of Systems: Negative except as noted in the HPI. Denies N/V/F/Ch.   Objective:  There were no vitals filed for this visit. There is no height or weight on file to calculate BMI. Constitutional Well developed. Well nourished.  Vascular Dorsalis pedis pulses palpable bilaterally. Posterior tibial pulses palpable bilaterally. Capillary refill normal to all digits.  No cyanosis or clubbing noted. Pedal hair growth normal.  Neurologic Normal speech. Oriented to person, place, and time. Epicritic sensation to light touch grossly present bilaterally.  Dermatologic Nails well groomed and normal in appearance. No open wounds. No skin lesions.  Orthopedic: Normal joint ROM without pain or crepitus bilaterally. No visible deformities. Decreased tender to palpation at the calcaneal tuber bilaterally. No pain with calcaneal squeeze bilaterally. Ankle ROM diminished range of motion bilaterally. Silfverskiold Test: negative bilaterally.   Radiographs: Taken and reviewed. No acute fractures or  dislocations. No evidence of stress fracture.  Plantar heel spur  present on the right foot but not left . Posterior heel spur absent.   Assessment:   1. Plantar fasciitis, bilateral     Plan:  Patient was evaluated and treated and all questions answered.  Plantar Fasciitis, bilaterally, improved after steroid injeciton - XR reviewed as above.  - Educated on icing and stretching. Instructions given.  - deferred further injection at this time, will consider in future if it flares up - DME: Power step orthotics continue use.  - recommend shoe gear modification - patient will return as needed for further injection if the pain worsens  Return if symptoms worsen or fail to improve.

## 2022-04-19 ENCOUNTER — Other Ambulatory Visit (HOSPITAL_COMMUNITY): Payer: Self-pay | Admitting: Interventional Radiology

## 2022-04-19 DIAGNOSIS — I771 Stricture of artery: Secondary | ICD-10-CM

## 2022-05-05 ENCOUNTER — Ambulatory Visit (HOSPITAL_COMMUNITY): Payer: Medicaid Other

## 2022-05-06 ENCOUNTER — Ambulatory Visit (HOSPITAL_COMMUNITY)
Admission: RE | Admit: 2022-05-06 | Discharge: 2022-05-06 | Disposition: A | Discharge status: Home / Self Care | Payer: Medicaid Other | Source: Ambulatory Visit | Attending: Interventional Radiology | Admitting: Interventional Radiology

## 2022-05-06 ENCOUNTER — Telehealth (HOSPITAL_COMMUNITY): Payer: Self-pay

## 2022-05-06 DIAGNOSIS — I771 Stricture of artery: Secondary | ICD-10-CM | POA: Insufficient documentation

## 2022-05-06 NOTE — Telephone Encounter (Signed)
Pt agreed to f/u in 6 months with a us carotid. AB  

## 2022-05-06 NOTE — Progress Notes (Signed)
VASCULAR LAB    Carotid duplex has been performed.  See CV proc for preliminary results.   Keen Ewalt, RVT 05/06/2022, 9:28 AM

## 2022-11-09 ENCOUNTER — Other Ambulatory Visit (HOSPITAL_COMMUNITY): Payer: Self-pay | Admitting: Interventional Radiology

## 2022-11-09 ENCOUNTER — Telehealth (HOSPITAL_COMMUNITY): Payer: Self-pay

## 2022-11-09 DIAGNOSIS — I771 Stricture of artery: Secondary | ICD-10-CM

## 2022-11-09 NOTE — Telephone Encounter (Signed)
Called to schedule US carotid, no answer, left vm. AB

## 2022-11-25 ENCOUNTER — Ambulatory Visit (HOSPITAL_COMMUNITY)
Admission: RE | Admit: 2022-11-25 | Discharge: 2022-11-25 | Disposition: A | Discharge status: Home / Self Care | Payer: Medicaid Other | Source: Ambulatory Visit | Attending: Interventional Radiology | Admitting: Interventional Radiology

## 2022-11-25 DIAGNOSIS — I771 Stricture of artery: Secondary | ICD-10-CM | POA: Insufficient documentation

## 2022-11-29 ENCOUNTER — Telehealth (HOSPITAL_COMMUNITY): Payer: Self-pay

## 2022-11-29 NOTE — Telephone Encounter (Signed)
Pt agreed to f/u in 1 year with a US carotid. AB

## 2023-09-30 ENCOUNTER — Encounter (HOSPITAL_COMMUNITY): Payer: Self-pay | Admitting: Interventional Radiology

## 2024-02-16 ENCOUNTER — Ambulatory Visit (HOSPITAL_BASED_OUTPATIENT_CLINIC_OR_DEPARTMENT_OTHER): Admitting: Cardiology

## 2024-03-06 ENCOUNTER — Encounter (INDEPENDENT_AMBULATORY_CARE_PROVIDER_SITE_OTHER): Payer: Self-pay

## 2024-03-12 ENCOUNTER — Ambulatory Visit: Admitting: Internal Medicine

## 2024-03-12 NOTE — Progress Notes (Deleted)
  Cardiology Office Note:  .    Date:  03/12/2024  ID:  [name], DOB Apr 01, 1976, MRN [id number] PCP: Marelyn Quill, MD  [facility] Providers Cardiologist:  None { Click to update primary MD,subspecialty MD or APP then REFRESH:1}    CC: *** Consulted for the evaluation of CAD and HLDat the behest of ***   History of Present Illness: .    Tabitha Christensen is a 47 y.o. female ***  Discussed the use of AI scribe software for clinical note transcription with the patient, who gave verbal consent to proceed.   Relevant histories: .  Social *** ROS: As per HPI.   Studies Reviewed: .           Risk Assessment/Calculations:    {Does this patient have ATRIAL FIBRILLATION?:[phone]}       Physical Exam:    VS:  LMP 06/23/2012    Wt Readings from Last 3 Encounters:  02/25/21 159 lb 12.8 oz (72.5 kg)  08/20/20 166 lb (75.3 kg)  02/19/20 155 lb (70.3 kg)    Gen: *** distress, *** obese/well nourished/malnourished   Neck: No JVD, *** carotid bruit Ears: *** Frank Sign Cardiac: No Rubs or Gallops, *** Murmur, ***cardia, *** radial pulses Respiratory: Clear to auscultation bilaterally, *** effort, ***  respiratory rate GI: Soft, nontender, non-distended *** MS: No *** edema; *** moves all extremities Integument: Skin feels *** Neuro:  At time of evaluation, alert and oriented to person/place/time/situation *** Psych: Normal affect, patient feels ***  No BP recorded.  {Refresh Note OR Click here to enter BP  :1}***    ASSESSMENT AND PLAN: .    *** An EKG was ordered for *** and shows ***  Stanly Leavens, MD FASE [facility] Cardiologist [facility]  [address] [place], [place] [address] [phone]  8:11 AM

## 2024-04-12 ENCOUNTER — Other Ambulatory Visit (HOSPITAL_COMMUNITY): Payer: Self-pay

## 2024-04-12 ENCOUNTER — Ambulatory Visit: Attending: Cardiology | Admitting: Cardiology

## 2024-04-12 ENCOUNTER — Encounter: Payer: Self-pay | Admitting: Cardiology

## 2024-04-12 VITALS — BP 120/83 | HR 88 | Resp 16 | Ht 62.0 in | Wt 146.5 lb

## 2024-04-12 DIAGNOSIS — Z8673 Personal history of transient ischemic attack (TIA), and cerebral infarction without residual deficits: Secondary | ICD-10-CM | POA: Diagnosis present

## 2024-04-12 DIAGNOSIS — I6521 Occlusion and stenosis of right carotid artery: Secondary | ICD-10-CM | POA: Diagnosis present

## 2024-04-12 DIAGNOSIS — F17210 Nicotine dependence, cigarettes, uncomplicated: Secondary | ICD-10-CM | POA: Diagnosis present

## 2024-04-12 DIAGNOSIS — E782 Mixed hyperlipidemia: Secondary | ICD-10-CM | POA: Insufficient documentation

## 2024-04-12 MED ORDER — CLOPIDOGREL BISULFATE 75 MG PO TABS
75.0000 mg | ORAL_TABLET | Freq: Every day | ORAL | 3 refills | Status: AC
  Filled 2024-04-12: qty 90, 90d supply, fill #0

## 2024-04-12 MED ORDER — NICOTINE 14 MG/24HR TD PT24
14.0000 mg | MEDICATED_PATCH | Freq: Every day | TRANSDERMAL | 0 refills | Status: DC
  Filled 2024-04-12: qty 28, 28d supply, fill #0

## 2024-04-12 NOTE — Patient Instructions (Addendum)
 Medication Instructions:  INCREASE Plavix  to 75 mg daily  STOP Aspirin  325 mg daily   START Nicotine  patch 14 mg   *If you need a refill on your cardiac medications before your next appointment, please call your pharmacy*  Lab Work: CBC FASTING LIPID PANEL  Direct LDL   If you have labs (blood work) drawn today and your tests are completely normal, you will receive your results only by: MyChart Message (if you have MyChart) OR A paper copy in the mail If you have any lab test that is abnormal or we need to change your treatment, we will call you to review the results.  Testing/Procedures: Carotid us    Your physician has requested that you have a carotid duplex. This test is an ultrasound of the carotid arteries in your neck. It looks at blood flow through these arteries that supply the brain with blood. Allow one hour for this exam. There are no restrictions or special instructions.   Follow-Up: At [facility], you and your health needs are our priority.  As part of our continuing mission to provide you with exceptional heart care, our providers are all part of one team.  This team includes your primary Cardiologist (physician) and Advanced Practice Providers or APPs (Physician Assistants and Nurse Practitioners) who all work together to provide you with the care you need, when you need it.  Your next appointment:   6 month(s)  Provider:   Newman JINNY Lawrence, MD

## 2024-04-12 NOTE — Addendum Note (Signed)
 Addended by: MANDA LYLE NOVAK on: 04/12/2024 06:36 PM   Modules accepted: Orders
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
## 2024-04-26 ENCOUNTER — Ambulatory Visit (HOSPITAL_COMMUNITY)

## 2024-05-03 LAB — CBC
Hematocrit: 46.8 % — ABNORMAL HIGH (ref 34.0–46.6)
Hemoglobin: 15.3 g/dL (ref 11.1–15.9)
MCH: 29.9 pg (ref 26.6–33.0)
MCHC: 32.7 g/dL (ref 31.5–35.7)
MCV: 91 fL (ref 79–97)
Platelets: 377 10*3/uL (ref 150–450)
RBC: 5.12 x10E6/uL (ref 3.77–5.28)
RDW: 12.9 % (ref 11.7–15.4)
WBC: 11.1 10*3/uL — ABNORMAL HIGH (ref 3.4–10.8)

## 2024-05-03 LAB — LIPID PANEL
Chol/HDL Ratio: 6.4 ratio — ABNORMAL HIGH (ref 0.0–4.4)
Cholesterol, Total: 238 mg/dL — ABNORMAL HIGH (ref 100–199)
HDL: 37 mg/dL — ABNORMAL LOW
LDL Chol Calc (NIH): 107 mg/dL — ABNORMAL HIGH (ref 0–99)
Triglycerides: 546 mg/dL — ABNORMAL HIGH (ref 0–149)
VLDL Cholesterol Cal: 94 mg/dL — ABNORMAL HIGH (ref 5–40)

## 2024-05-03 LAB — LDL CHOLESTEROL, DIRECT: LDL Direct: 95 mg/dL (ref 0–99)

## 2024-05-04 ENCOUNTER — Ambulatory Visit: Payer: Self-pay | Admitting: Cardiology

## 2024-05-04 DIAGNOSIS — E782 Mixed hyperlipidemia: Secondary | ICD-10-CM

## 2024-05-04 NOTE — Progress Notes (Signed)
 Cholesterol and triglyceride remain elevated.  Increase Lipitor to 80 mg daily, and add fenofibrate 145 mg daily.  Repeat fasting lipid panel in 07/2023.  If LDL and triglyceride remain elevated, will then refer to lipid clinic.  Thanks MJP

## 2024-05-06 NOTE — Progress Notes (Signed)
 Recommend referral to lipid clinic.  Thanks MJP

## 2024-05-07 NOTE — Telephone Encounter (Signed)
 Pt returned call to follow up.

## 2024-05-09 MED ORDER — NICOTINE 14 MG/24HR TD PT24: 14.0000 mg | MEDICATED_PATCH | Freq: Every day | TRANSDERMAL | 0 refills | Status: AC

## 2024-05-09 NOTE — Progress Notes (Signed)
 Contacted and advised. Agreed with plan of care.

## 2024-05-10 ENCOUNTER — Ambulatory Visit (HOSPITAL_COMMUNITY): Admission: RE | Admit: 2024-05-10

## 2024-05-24 ENCOUNTER — Ambulatory Visit (HOSPITAL_COMMUNITY)

## 2024-06-21 ENCOUNTER — Ambulatory Visit: Admitting: Pharmacist
